# Patient Record
Sex: Male | Born: 1945 | Race: White | Hispanic: No | Marital: Married | Smoking: Never smoker
Health system: Southern US, Community
[De-identification: ages and names within clinical notes are randomized; demographics above are authoritative.]

## PROBLEM LIST (undated history)

## (undated) DIAGNOSIS — E785 Hyperlipidemia, unspecified: Secondary | ICD-10-CM

## (undated) DIAGNOSIS — I10 Essential (primary) hypertension: Secondary | ICD-10-CM

## (undated) DIAGNOSIS — R55 Syncope and collapse: Secondary | ICD-10-CM

## (undated) DIAGNOSIS — C801 Malignant (primary) neoplasm, unspecified: Secondary | ICD-10-CM

## (undated) DIAGNOSIS — R413 Other amnesia: Secondary | ICD-10-CM

## (undated) DIAGNOSIS — F32A Depression, unspecified: Secondary | ICD-10-CM

## (undated) DIAGNOSIS — R51 Headache: Secondary | ICD-10-CM

## (undated) DIAGNOSIS — E7800 Pure hypercholesterolemia, unspecified: Secondary | ICD-10-CM

## (undated) DIAGNOSIS — K219 Gastro-esophageal reflux disease without esophagitis: Secondary | ICD-10-CM

## (undated) DIAGNOSIS — F329 Major depressive disorder, single episode, unspecified: Secondary | ICD-10-CM

## (undated) DIAGNOSIS — F419 Anxiety disorder, unspecified: Secondary | ICD-10-CM

## (undated) HISTORY — DX: Malignant (primary) neoplasm, unspecified: C80.1

## (undated) HISTORY — PX: MOHS SURGERY: SUR867

## (undated) HISTORY — DX: Headache: R51

## (undated) HISTORY — DX: Other amnesia: R41.3

## (undated) HISTORY — DX: Hyperlipidemia, unspecified: E78.5

## (undated) HISTORY — DX: Anxiety disorder, unspecified: F41.9

## (undated) HISTORY — DX: Depression, unspecified: F32.A

## (undated) HISTORY — DX: Major depressive disorder, single episode, unspecified: F32.9

## (undated) HISTORY — DX: Essential (primary) hypertension: I10

## (undated) HISTORY — DX: Pure hypercholesterolemia, unspecified: E78.00

---

## 2004-11-10 ENCOUNTER — Ambulatory Visit: Payer: Self-pay | Admitting: Family Medicine

## 2005-02-10 ENCOUNTER — Ambulatory Visit: Payer: Self-pay | Admitting: Gastroenterology

## 2005-08-11 ENCOUNTER — Emergency Department: Payer: Self-pay | Admitting: Emergency Medicine

## 2005-11-09 ENCOUNTER — Encounter: Admission: RE | Admit: 2005-11-09 | Discharge: 2005-11-09 | Payer: Self-pay | Admitting: Gastroenterology

## 2005-11-15 ENCOUNTER — Encounter: Admission: RE | Admit: 2005-11-15 | Discharge: 2005-11-15 | Payer: Self-pay | Admitting: Gastroenterology

## 2009-04-19 ENCOUNTER — Emergency Department: Payer: Self-pay | Admitting: Emergency Medicine

## 2010-07-29 ENCOUNTER — Emergency Department: Payer: Self-pay | Admitting: Emergency Medicine

## 2010-08-05 ENCOUNTER — Ambulatory Visit: Payer: Self-pay | Admitting: Family Medicine

## 2012-02-19 ENCOUNTER — Ambulatory Visit (INDEPENDENT_AMBULATORY_CARE_PROVIDER_SITE_OTHER): Payer: 59 | Admitting: Psychiatry

## 2012-02-19 ENCOUNTER — Encounter (HOSPITAL_COMMUNITY): Payer: Self-pay | Admitting: Psychiatry

## 2012-02-19 VITALS — BP 130/80 | HR 52 | Wt 138.4 lb

## 2012-02-19 DIAGNOSIS — F09 Unspecified mental disorder due to known physiological condition: Secondary | ICD-10-CM

## 2012-02-19 DIAGNOSIS — F39 Unspecified mood [affective] disorder: Secondary | ICD-10-CM

## 2012-02-19 MED ORDER — DIVALPROEX SODIUM ER 250 MG PO TB24: 250.0000 mg | ORAL_TABLET | Freq: Every day | ORAL | Status: DC

## 2012-02-19 NOTE — Patient Instructions (Signed)
Please contact her primary care physician for neurology consult.  I will see you in 2 weeks.
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
## 2012-02-23 ENCOUNTER — Telehealth (HOSPITAL_COMMUNITY): Payer: Self-pay

## 2012-02-23 NOTE — Telephone Encounter (Signed)
12:34pm 02/23/12 called and left msg informed pt that a medical release form need to be complete before medical information cna be sent to dr. Webb Silversmith office./sh

## 2012-03-08 ENCOUNTER — Ambulatory Visit (HOSPITAL_COMMUNITY): Payer: Self-pay | Admitting: Psychiatry

## 2012-03-13 ENCOUNTER — Ambulatory Visit (INDEPENDENT_AMBULATORY_CARE_PROVIDER_SITE_OTHER): Payer: 59 | Admitting: Psychiatry

## 2012-03-13 ENCOUNTER — Encounter (HOSPITAL_COMMUNITY): Payer: Self-pay | Admitting: Psychiatry

## 2012-03-13 VITALS — BP 120/81 | HR 63 | Wt 161.2 lb

## 2012-03-13 DIAGNOSIS — F39 Unspecified mood [affective] disorder: Secondary | ICD-10-CM

## 2012-03-13 DIAGNOSIS — F09 Unspecified mental disorder due to known physiological condition: Secondary | ICD-10-CM

## 2012-03-13 MED ORDER — DIVALPROEX SODIUM ER 250 MG PO TB24: 250.0000 mg | ORAL_TABLET | Freq: Every day | ORAL | Status: DC
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
## 2012-04-15 ENCOUNTER — Other Ambulatory Visit (HOSPITAL_COMMUNITY): Payer: Self-pay | Admitting: *Deleted

## 2012-04-15 DIAGNOSIS — F39 Unspecified mood [affective] disorder: Secondary | ICD-10-CM

## 2012-04-15 MED ORDER — DIVALPROEX SODIUM ER 250 MG PO TB24: 250.0000 mg | ORAL_TABLET | Freq: Every day | ORAL | Status: DC

## 2012-04-24 ENCOUNTER — Ambulatory Visit (INDEPENDENT_AMBULATORY_CARE_PROVIDER_SITE_OTHER): Payer: 59 | Admitting: Psychiatry

## 2012-04-24 ENCOUNTER — Ambulatory Visit (HOSPITAL_COMMUNITY): Payer: Self-pay | Admitting: Psychiatry

## 2012-04-24 ENCOUNTER — Encounter (HOSPITAL_COMMUNITY): Payer: Self-pay | Admitting: Psychiatry

## 2012-04-24 VITALS — BP 139/83 | HR 55 | Wt 162.0 lb

## 2012-04-24 DIAGNOSIS — F09 Unspecified mental disorder due to known physiological condition: Secondary | ICD-10-CM

## 2012-04-24 DIAGNOSIS — F39 Unspecified mood [affective] disorder: Secondary | ICD-10-CM

## 2012-04-24 DIAGNOSIS — F0630 Mood disorder due to known physiological condition, unspecified: Secondary | ICD-10-CM
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
## 2012-06-11 ENCOUNTER — Other Ambulatory Visit (HOSPITAL_COMMUNITY): Payer: Self-pay | Admitting: *Deleted

## 2012-06-11 DIAGNOSIS — F39 Unspecified mood [affective] disorder: Secondary | ICD-10-CM

## 2012-06-11 MED ORDER — DIVALPROEX SODIUM ER 250 MG PO TB24: 250.0000 mg | ORAL_TABLET | Freq: Every day | ORAL | Status: DC

## 2012-07-17 ENCOUNTER — Ambulatory Visit (HOSPITAL_COMMUNITY): Payer: Self-pay | Admitting: Psychiatry

## 2012-08-12 ENCOUNTER — Other Ambulatory Visit (HOSPITAL_COMMUNITY): Payer: Self-pay | Admitting: *Deleted

## 2012-08-12 DIAGNOSIS — F39 Unspecified mood [affective] disorder: Secondary | ICD-10-CM

## 2012-08-12 NOTE — Telephone Encounter (Signed)
Received faxed refill request for Divalproex ER. Per appointment note for appt 07/17/12, patient cancelled appt.  Pt is consolidating care and will not return to this office.

## 2012-08-13 ENCOUNTER — Other Ambulatory Visit (HOSPITAL_COMMUNITY): Payer: Self-pay | Admitting: Psychiatry

## 2012-10-04 ENCOUNTER — Ambulatory Visit: Payer: Self-pay | Admitting: Family Medicine

## 2012-11-18 ENCOUNTER — Ambulatory Visit: Payer: Self-pay | Admitting: Nurse Practitioner

## 2012-12-03 ENCOUNTER — Encounter: Payer: Self-pay | Admitting: Nurse Practitioner

## 2012-12-03 ENCOUNTER — Ambulatory Visit (INDEPENDENT_AMBULATORY_CARE_PROVIDER_SITE_OTHER): Payer: BC Managed Care – PPO | Admitting: Nurse Practitioner

## 2012-12-03 VITALS — BP 127/73 | HR 58 | Ht 68.75 in | Wt 165.0 lb

## 2012-12-03 DIAGNOSIS — F341 Dysthymic disorder: Secondary | ICD-10-CM

## 2012-12-03 DIAGNOSIS — E785 Hyperlipidemia, unspecified: Secondary | ICD-10-CM

## 2012-12-03 DIAGNOSIS — R413 Other amnesia: Secondary | ICD-10-CM

## 2012-12-03 DIAGNOSIS — I10 Essential (primary) hypertension: Secondary | ICD-10-CM | POA: Insufficient documentation

## 2012-12-03 NOTE — Patient Instructions (Addendum)
Setup for neuropsych testing with Dr. Leonides Cave Continue your followup with Dr. Loralie Champagne  Office Followup yearly

## 2012-12-03 NOTE — Progress Notes (Signed)
HPI: The patient returns for followup after his last visit 05/20/2012. His neuropsych evaluation in October of last year showed anxiety and depressive disorder and a treatment plan of counseling and medication was recommended. Followup exam in one year to 18 months. He was referred to a local psychiatrist and is now in counseling. 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 He is currently on Adderall and his symptoms are much better. He is also on antidepressant medication. He has no new neurologic complaints  ROS:  -ringing in ears, constipation, urination problems, impotence, cramps, aching muscles, tremor, depression, anxiety, too much sleep  Physical Exam General: well developed, well nourished, seated, in no evident distress Head: head normocephalic and atraumatic. Oropharynx benign Neck: supple with no carotid or supraclavicular bruits Cardiovascular: regular rate and rhythm, no murmurs  Neurologic Exam Mental Status: Awake and fully alert. Oriented to place and time.  Attention span, concentration and fund of knowledge appropriate. Mood and affect appropriate.  Cranial Nerves: Pupils equal, briskly reactive to light. Extraocular movements full without nystagmus. Visual fields full to confrontation. Hearing intact and symmetric to finger snap. Facial sensation intact. Face, tongue, palate move normally and symmetrically. Neck flexion and extension normal.  Motor: Normal bulk and tone. Normal strength in all tested extremity muscles. Sensory.: intact to touch and pinprick and vibratory.  Coordination: Rapid alternating movements normal in all extremities. Finger-to-nose and heel-to-shin performed accurately bilaterally. Gait and Station: Arises from chair without difficulty. Stance is normal. Gait demonstrates normal stride length and balance . Able to heel, toe and tandem walk without difficulty.  Reflexes: 2+ and symmetric. Toes downgoing.     ASSESSMENT: Past history of hypertension,  hyperlipidemia depression. Neuropsych evaluation results are not consistent with dementia or mild cognitive impairment. He is currently seeing a counselor as well as psychiatrist. He has been told he has ADD and is on Adderall with good results      PLAN: Setup for neuropsych testing with Dr. Leonides Cave in September or October 2014 Continue  followup with Dr. Rolly Pancake [facility]  Office Followup yearly  Nilda Riggs, [place] APRN

## 2012-12-05 ENCOUNTER — Ambulatory Visit: Payer: Self-pay | Admitting: Hematology and Oncology

## 2012-12-06 ENCOUNTER — Ambulatory Visit: Payer: Self-pay | Admitting: Hematology and Oncology

## 2012-12-29 ENCOUNTER — Ambulatory Visit: Payer: Self-pay | Admitting: Hematology and Oncology

## 2013-05-05 ENCOUNTER — Telehealth: Payer: Self-pay | Admitting: Nurse Practitioner

## 2013-05-05 DIAGNOSIS — R413 Other amnesia: Secondary | ICD-10-CM

## 2013-12-03 ENCOUNTER — Telehealth: Payer: Self-pay | Admitting: Nurse Practitioner

## 2013-12-03 ENCOUNTER — Ambulatory Visit: Payer: BC Managed Care – PPO | Admitting: Nurse Practitioner

## 2013-12-03 NOTE — Telephone Encounter (Signed)
No show for scheduled appt 

## 2014-10-19 ENCOUNTER — Ambulatory Visit: Payer: Self-pay | Admitting: Adult Health

## 2015-05-27 NOTE — Telephone Encounter (Signed)
Error

## 2015-06-14 ENCOUNTER — Encounter: Payer: Self-pay | Admitting: Emergency Medicine

## 2015-06-14 ENCOUNTER — Emergency Department
Admission: EM | Admit: 2015-06-14 | Discharge: 2015-06-14 | Disposition: A | Discharge status: Home / Self Care | Payer: Medicare Other | Attending: Emergency Medicine | Admitting: Emergency Medicine

## 2015-06-14 DIAGNOSIS — Z7982 Long term (current) use of aspirin: Secondary | ICD-10-CM | POA: Insufficient documentation

## 2015-06-14 DIAGNOSIS — R45851 Suicidal ideations: Secondary | ICD-10-CM | POA: Diagnosis not present

## 2015-06-14 DIAGNOSIS — F329 Major depressive disorder, single episode, unspecified: Secondary | ICD-10-CM | POA: Diagnosis not present

## 2015-06-14 DIAGNOSIS — Z79899 Other long term (current) drug therapy: Secondary | ICD-10-CM | POA: Diagnosis not present

## 2015-06-14 DIAGNOSIS — F32A Depression, unspecified: Secondary | ICD-10-CM

## 2015-06-14 DIAGNOSIS — Z7951 Long term (current) use of inhaled steroids: Secondary | ICD-10-CM | POA: Insufficient documentation

## 2015-06-14 DIAGNOSIS — I10 Essential (primary) hypertension: Secondary | ICD-10-CM | POA: Diagnosis not present

## 2015-06-14 DIAGNOSIS — R4182 Altered mental status, unspecified: Secondary | ICD-10-CM | POA: Diagnosis present

## 2015-06-14 DIAGNOSIS — R51 Headache: Secondary | ICD-10-CM

## 2015-06-14 DIAGNOSIS — R519 Headache, unspecified: Secondary | ICD-10-CM | POA: Diagnosis present

## 2015-06-14 DIAGNOSIS — F39 Unspecified mood [affective] disorder: Secondary | ICD-10-CM | POA: Diagnosis not present

## 2015-06-14 DIAGNOSIS — R413 Other amnesia: Secondary | ICD-10-CM | POA: Diagnosis present

## 2015-06-14 LAB — CBC
HCT: 49.7 % (ref 40.0–52.0)
Hemoglobin: 16.8 g/dL (ref 13.0–18.0)
MCH: 30.1 pg (ref 26.0–34.0)
MCHC: 33.7 g/dL (ref 32.0–36.0)
MCV: 89.2 fL (ref 80.0–100.0)
PLATELETS: 156 10*3/uL (ref 150–440)
RBC: 5.57 MIL/uL (ref 4.40–5.90)
RDW: 12.4 % (ref 11.5–14.5)
WBC: 4.8 10*3/uL (ref 3.8–10.6)

## 2015-06-14 LAB — URINE DRUG SCREEN, QUALITATIVE (ARMC ONLY)
Amphetamines, Ur Screen: POSITIVE — AB
BENZODIAZEPINE, UR SCRN: NOT DETECTED
Barbiturates, Ur Screen: NOT DETECTED
CANNABINOID 50 NG, UR ~~LOC~~: NOT DETECTED
Cocaine Metabolite,Ur ~~LOC~~: NOT DETECTED
MDMA (ECSTASY) UR SCREEN: NOT DETECTED
Methadone Scn, Ur: NOT DETECTED
Opiate, Ur Screen: NOT DETECTED
PHENCYCLIDINE (PCP) UR S: NOT DETECTED
Tricyclic, Ur Screen: NOT DETECTED

## 2015-06-14 LAB — SALICYLATE LEVEL: Salicylate Lvl: <4 mg/dL (ref 2.8–30.0)

## 2015-06-14 LAB — COMPREHENSIVE METABOLIC PANEL
ALT: 22 U/L (ref 17–63)
AST: 25 U/L (ref 15–41)
Albumin: 4.5 g/dL (ref 3.5–5.0)
Alkaline Phosphatase: 75 U/L (ref 38–126)
Anion gap: 3 — ABNORMAL LOW (ref 5–15)
BILIRUBIN TOTAL: 1 mg/dL (ref 0.3–1.2)
BUN: 18 mg/dL (ref 6–20)
CO2: 30 mmol/L (ref 22–32)
CREATININE: 1.14 mg/dL (ref 0.61–1.24)
Calcium: 9.6 mg/dL (ref 8.9–10.3)
Chloride: 106 mmol/L (ref 101–111)
GFR calc Af Amer: >60 mL/min (ref >60)
Glucose, Bld: 114 mg/dL — ABNORMAL HIGH (ref 65–99)
Potassium: 4.7 mmol/L (ref 3.5–5.1)
Sodium: 139 mmol/L (ref 135–145)
TOTAL PROTEIN: 7.3 g/dL (ref 6.5–8.1)

## 2015-06-14 LAB — ACETAMINOPHEN LEVEL: Acetaminophen (Tylenol), Serum: <10 ug/mL — ABNORMAL LOW (ref 10–30)

## 2015-06-14 LAB — ETHANOL

## 2015-06-14 NOTE — ED Notes (Signed)
BEHAVIORAL HEALTH ROUNDING  Patient sleeping: No.  Patient alert and oriented: yes  Behavior appropriate: Yes. ; If no, describe:  Nutrition and fluids offered: Yes  Toileting and hygiene offered: Yes  Sitter present: yes- 15 min safety sitter  Law enforcement present: Yes  ENVIRONMENTAL ASSESSMENT  Potentially harmful objects out of patient reach: Yes.  Personal belongings secured: Yes.  Patient dressed in hospital provided attire only: Yes.  Plastic bags out of patient reach: Yes.  Patient care equipment (cords, cables, call bells, lines, and drains) shortened, removed, or accounted for: Yes.  Equipment and supplies removed from bottom of stretcher: Yes.  Potentially toxic materials out of patient reach: Yes.  Sharps container removed or out of patient reach: Yes.

## 2015-06-14 NOTE — ED Notes (Signed)
EDP at bedside for examine.
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
## 2015-06-14 NOTE — BH Assessment (Signed)
Received(via fax) a copy of the "Referral List" of Psych MD's, from the office of the patient's current outpatient provider (Dr. Ulis Rias).  They are expecting to close at the end of this month (06/2015).  A copy was giving to the patient.   Patient continues to deny SI/HI and AV/H.

## 2015-06-14 NOTE — ED Notes (Signed)
Pt informed to return if any life threatening symptoms occur.  

## 2015-06-14 NOTE — ED Notes (Signed)
App. 2 months ago, stopped one of his migraine medications, started having auras for migraine but no headaches, last couple of weeks, experiencing auras related to migraines, now states feeling little confused and disoriented, sounds are "hollow", not sure about balance, words are harder and fogginess

## 2015-06-14 NOTE — ED Notes (Signed)
Pt to ed with c/o headache x several weeks,  Reports over the last 5 days he has had increased slurred speech and difficulty forming words.
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
## 2015-06-14 NOTE — Discharge Instructions (Signed)
Please call Dr. Bridgett Larsson for follow-up. Return to the emergency department if you have further urgent concerns.  Major Depressive Disorder Major depressive disorder is a mental illness. It also may be called clinical depression or unipolar depression. Major depressive disorder usually causes feelings of sadness, hopelessness, or helplessness. Some people with this disorder do not feel particularly sad but lose interest in doing things they used to enjoy (anhedonia). Major depressive disorder also can cause physical symptoms. It can interfere with work, school, relationships, and other normal everyday activities. The disorder varies in severity but is longer lasting and more serious than the sadness we all feel from time to time in our lives. Major depressive disorder often is triggered by stressful life events or major life changes. Examples of these triggers include divorce, loss of your job or home, a move, and the death of a family member or close friend. Sometimes this disorder occurs for no obvious reason at all. People who have family members with major depressive disorder or bipolar disorder are at higher risk for developing this disorder, with or without life stressors. Major depressive disorder can occur at any age. It may occur just once in your life (single episode major depressive disorder). It may occur multiple times (recurrent major depressive disorder). SYMPTOMS People with major depressive disorder have either anhedonia or depressed mood on nearly a daily basis for at least 2 weeks or longer. Symptoms of depressed mood include:  Feelings of sadness (blue or down in the dumps) or emptiness.  Feelings of hopelessness or helplessness.  Tearfulness or episodes of crying (may be observed by others).  Irritability (children and adolescents). In addition to depressed mood or anhedonia or both, people with this disorder have at least four of the following symptoms:  Difficulty sleeping or  sleeping too much.   Significant change (increase or decrease) in appetite or weight.   Lack of energy or motivation.  Feelings of guilt and worthlessness.   Difficulty concentrating, remembering, or making decisions.  Unusually slow movement (psychomotor retardation) or restlessness (as observed by others).   Recurrent wishes for death, recurrent thoughts of self-harm (suicide), or a suicide attempt. People with major depressive disorder commonly have persistent negative thoughts about themselves, other people, and the world. People with severe major depressive disorder may experiencedistorted beliefs or perceptions about the world (psychotic delusions). They also may see or hear things that are not real (psychotic hallucinations). DIAGNOSIS Major depressive disorder is diagnosed through an assessment by your health care provider. Your health care provider will ask aboutaspects of your daily life, such as mood,sleep, and appetite, to see if you have the diagnostic symptoms of major depressive disorder. Your health care provider may ask about your medical history and use of alcohol or drugs, including prescription medicines. Your health care provider also may do a physical exam and blood work. This is because certain medical conditions and the use of certain substances can cause major depressive disorder-like symptoms (secondary depression). Your health care provider also may refer you to a mental health specialist for further evaluation and treatment. TREATMENT It is important to recognize the symptoms of major depressive disorder and seek treatment. The following treatments can be prescribed for this disorder:   Medicine. Antidepressant medicines usually are prescribed. Antidepressant medicines are thought to correct chemical imbalances in the brain that are commonly associated with major depressive disorder. Other types of medicine may be added if the symptoms do not respond to  antidepressant medicines alone or if psychotic delusions  or hallucinations occur.  Talk therapy. Talk therapy can be helpful in treating major depressive disorder by providing support, education, and guidance. Certain types of talk therapy also can help with negative thinking (cognitive behavioral therapy) and with relationship issues that trigger this disorder (interpersonal therapy). A mental health specialist can help determine which treatment is best for you. Most people with major depressive disorder do well with a combination of medicine and talk therapy. Treatments involving electrical stimulation of the brain can be used in situations with extremely severe symptoms or when medicine and talk therapy do not work over time. These treatments include electroconvulsive therapy, transcranial magnetic stimulation, and vagal nerve stimulation.   This information is not intended to replace advice given to you by your health care provider. Make sure you discuss any questions you have with your health care provider.   Document Released: 11/11/2012 Document Revised: 08/07/2014 Document Reviewed: 11/11/2012 Elsevier Interactive Patient Education [employer].

## 2015-06-14 NOTE — ED Provider Notes (Signed)
[facility] Emergency Department Provider Note   ____________________________________________  Time seen:  I have reviewed the triage vital signs and the triage nursing note.  HISTORY  Chief Complaint Migraine and Altered Mental Status   Historian Patient  HPI Brad Randall is a 69 y.o. male who is a history of depression, and migraines, is here for evaluation of worsening depression, labile mood/agitation, as well as vague thoughts of suicide including that he would be better off "gone. "  Patient states he has been admitted to the hospital for depression and homicidal/suicidal ideation in the 64s. He is followed by Dr.Chen? In psychiatry. Patient states that he had taken himself off of the escalapram for several weeks before restarting about a month ago because he thought he probably didn't need it.  Patient states he's had some brain fog or trouble concentrating and trouble assimilating new information over the past several years which makes him worried that he could be developing Alzheimer's like his uncle. Patient states that he has migraine auras frequently, but he had taken himself off ofhis migraine medications.  Today he is not here for migraine. He is not here for confusion or altered mental status. He is here for help with depression and suicidal thoughts. He states that he has always been able to stop himself by thinking about his children and grandchildren. He is here for psychiatric help.    Past Medical History  Diagnosis Date  . Headache(784.0)   . HTN (hypertension)   . Hyperlipidemia   . High cholesterol   . Anxiety and depression   . Cancer ([place])   . Memory loss     Patient Active Problem List   Diagnosis Date Noted  . Memory loss 12/03/2012  . 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 Unspecified essential hypertension 12/03/2012  . Other and unspecified hyperlipidemia 12/03/2012  . HA (headache) 02/19/2012    History reviewed. No  pertinent past surgical history.  Current Outpatient Rx  Name  Route  Sig  Dispense  Refill  . escitalopram (LEXAPRO) 5 MG tablet   Oral   Take 5 mg by mouth daily.         . fluticasone (FLONASE) 50 MCG/ACT nasal spray   Each Nare   Place into both nostrils daily.         Marland Kitchen ALPRAZolam (XANAX) 0.25 MG tablet               . amphetamine-dextroamphetamine (ADDERALL) 5 MG tablet   Oral   Take 5 mg by mouth 2 (two) times daily. Take one tab in the morning and one tab in the pm         . aspirin 81 MG tablet   Oral   Take 81 mg by mouth daily.         . bisoprolol-hydrochlorothiazide (ZIAC) 2.5-6.25 MG per tablet               . EXPIRED: divalproex (DEPAKOTE ER) 250 MG 24 hr tablet   Oral   Take 1 tablet (250 mg total) by mouth at bedtime.   30 tablet   1   . naratriptan (AMERGE) 2.5 MG tablet   Oral   Take 2.5 mg by mouth as needed for migraine.          Marland Kitchen PARoxetine (PAXIL) 10 MG tablet   Oral   Take by mouth at bedtime. Take 15 mg at bedtime         . potassium chloride (  K-DUR) 10 MEQ tablet   Oral   Take 10 mEq by mouth daily.         Marland Kitchen zonisamide (ZONEGRAN) 25 MG capsule                 Allergies Review of patient's allergies indicates no known allergies.  Family History  Problem Relation Age of Onset  . Cancer    . Cancer Other     Social History Social History  Substance Use Topics  . Smoking status: Never Smoker   . Smokeless tobacco: Never Used  . Alcohol Use: No    Review of Systems  Constitutional: Negative for fever. Eyes: Negative for visual changes. ENT: Negative for sore throat. Cardiovascular: Negative for chest pain. Respiratory: Negative for shortness of breath. Gastrointestinal: Negative for abdominal pain, vomiting and diarrhea. Genitourinary: Negative for dysuria. Musculoskeletal: Negative for back pain. Skin: Negative for rash. Neurological: Negative for headache. 10 point Review of Systems otherwise  negative ____________________________________________   PHYSICAL EXAM:  VITAL SIGNS: ED Triage Vitals  Enc Vitals Group     BP 06/14/15 1040 161/84 mmHg     Pulse Rate 06/14/15 1040 53     Resp 06/14/15 1040 16     Temp 06/14/15 1040 97.7 F (36.5 C)     Temp Source 06/14/15 1040 Oral     SpO2 06/14/15 1040 99 %     Weight 06/14/15 1040 150 lb (68.04 kg)     Height 06/14/15 1040 5\' 8"  (1.727 m)     Head Cir --      Peak Flow --      Pain Score --      Pain Loc --      Pain Edu? --      Excl. in [place]? --      Constitutional: Alert and oriented. Well appearing and in no distress. Eyes: Conjunctivae are normal. PERRL. Normal extraocular movements. ENT   Head: Normocephalic and atraumatic.   Nose: No congestion/rhinnorhea.   Mouth/Throat: Mucous membranes are moist.   Neck: No stridor. Cardiovascular/Chest: Normal rate, regular rhythm.  No murmurs, rubs, or gallops. Respiratory: Normal respiratory effort without tachypnea nor retractions. Breath sounds are clear and equal bilaterally. No wheezes/rales/rhonchi. Gastrointestinal: Soft. No distention, no guarding, no rebound. Nontender   Genitourinary/rectal:Deferred Musculoskeletal: Nontender with normal range of motion in all extremities. No joint effusions.  No lower extremity tenderness.  No edema. Neurologic:  Normal speech and language. No gross or focal neurologic deficits are appreciated. Skin:  Skin is warm, dry and intact. No rash noted. Psychiatric: Moderate depressed mood with vague suicidal ideation. No homicidal ideation. Appropriate insight and judgment.  ____________________________________________   EKG I, Lisa Roca, MD, the attending physician have personally viewed and interpreted all ECGs.  No EKG performed ____________________________________________  LABS (pertinent positives/negatives)  Comprehensive metabolic panel within normal limits Alcohol less than 5 Acetaminophen and  salicylate levels negative CBC within normal limits Urine drug screen positive for amphetamine and otherwise negative  ____________________________________________  RADIOLOGY All Xrays were viewed by me. Imaging interpreted by Radiologist.  None __________________________________________  PROCEDURES  Procedure(s) performed: None  Critical Care performed: None  ____________________________________________   ED COURSE / ASSESSMENT AND PLAN  CONSULTATIONS: Consult to TTS and psychiatry  Pertinent labs & imaging results that were available during my care of the patient were reviewed by me and considered in my medical decision making (see chart for details).   The patient is here for psychiatric help due to depression,  agitated mood, and vague suicidal thoughts. He does describe his previous problems with migraines and trouble assimilating new information, however this is not an acute problem nor his complaint today.  Given his suicidal ideation and is depressed mood, along with his history, I have placed him under involuntary commitment for safety until he is evaluated by a psychiatrist.  At present he is very cooperative, and understanding, and I feel he is safe for the move to the behavioral unit as space needs.  Patient / Family / Caregiver informed of clinical course, medical decision-making process, and agree with plan.   ___________________________________________   FINAL CLINICAL IMPRESSION(S) / ED DIAGNOSES   Final diagnoses:  Depression  Suicidal ideation       Lisa Roca, MD 06/14/15 1435

## 2015-06-14 NOTE — ED Provider Notes (Addendum)
-----------------------------------------   5:31 PM on 06/14/2015 -----------------------------------------  Patient has been seen by Dr. [place] Cooks. The patient does not have any suicidal homicidal ideation. Dr. [place] Cooks has rescinded the involuntary commitment and reports the patient is stable for discharge. He is a patient of Dr. Lianne Moris, and though he may need to change provider's, the plan is for him to follow-up with Dr. Bridgett Larsson later this week.    Ahmed Prima, MD 06/14/15 1732  Ahmed Prima, MD 06/14/15 [phone]

## 2015-09-10 ENCOUNTER — Encounter: Payer: Self-pay | Admitting: *Deleted

## 2015-09-13 ENCOUNTER — Ambulatory Visit
Admission: RE | Admit: 2015-09-13 | Discharge: 2015-09-13 | Disposition: A | Discharge status: Home Health | Payer: Medicare Other | Source: Ambulatory Visit | Attending: Gastroenterology | Admitting: Gastroenterology

## 2015-09-13 ENCOUNTER — Encounter: Payer: Self-pay | Admitting: *Deleted

## 2015-09-13 ENCOUNTER — Ambulatory Visit: Payer: Medicare Other | Admitting: Anesthesiology

## 2015-09-13 ENCOUNTER — Encounter
Admission: RE | Disposition: A | Discharge status: Home Health | Payer: Self-pay | Source: Ambulatory Visit | Attending: Gastroenterology

## 2015-09-13 DIAGNOSIS — F419 Anxiety disorder, unspecified: Secondary | ICD-10-CM | POA: Insufficient documentation

## 2015-09-13 DIAGNOSIS — I10 Essential (primary) hypertension: Secondary | ICD-10-CM | POA: Diagnosis not present

## 2015-09-13 DIAGNOSIS — D124 Benign neoplasm of descending colon: Secondary | ICD-10-CM | POA: Diagnosis not present

## 2015-09-13 DIAGNOSIS — C210 Malignant neoplasm of anus, unspecified: Secondary | ICD-10-CM | POA: Diagnosis not present

## 2015-09-13 DIAGNOSIS — K5900 Constipation, unspecified: Secondary | ICD-10-CM | POA: Insufficient documentation

## 2015-09-13 DIAGNOSIS — E7800 Pure hypercholesterolemia, unspecified: Secondary | ICD-10-CM | POA: Diagnosis not present

## 2015-09-13 DIAGNOSIS — M179 Osteoarthritis of knee, unspecified: Secondary | ICD-10-CM | POA: Diagnosis not present

## 2015-09-13 DIAGNOSIS — K625 Hemorrhage of anus and rectum: Secondary | ICD-10-CM | POA: Diagnosis present

## 2015-09-13 DIAGNOSIS — G43909 Migraine, unspecified, not intractable, without status migrainosus: Secondary | ICD-10-CM | POA: Insufficient documentation

## 2015-09-13 DIAGNOSIS — Z7951 Long term (current) use of inhaled steroids: Secondary | ICD-10-CM | POA: Insufficient documentation

## 2015-09-13 DIAGNOSIS — F329 Major depressive disorder, single episode, unspecified: Secondary | ICD-10-CM | POA: Insufficient documentation

## 2015-09-13 DIAGNOSIS — Z79899 Other long term (current) drug therapy: Secondary | ICD-10-CM | POA: Insufficient documentation

## 2015-09-13 DIAGNOSIS — Z85828 Personal history of other malignant neoplasm of skin: Secondary | ICD-10-CM | POA: Insufficient documentation

## 2015-09-13 DIAGNOSIS — E785 Hyperlipidemia, unspecified: Secondary | ICD-10-CM | POA: Diagnosis not present

## 2015-09-13 HISTORY — PX: COLONOSCOPY WITH PROPOFOL: SHX5780

## 2015-09-13 SURGERY — COLONOSCOPY WITH PROPOFOL
Anesthesia: General

## 2015-09-13 MED ORDER — PROPOFOL 10 MG/ML IV BOLUS
INTRAVENOUS | Status: DC | PRN
  Administered 2015-09-13: 40 mg via INTRAVENOUS

## 2015-09-13 MED ORDER — FENTANYL CITRATE (PF) 100 MCG/2ML IJ SOLN
INTRAMUSCULAR | Status: DC | PRN
  Administered 2015-09-13: 50 ug via INTRAVENOUS

## 2015-09-13 MED ORDER — SODIUM CHLORIDE 0.9 % IV SOLN
INTRAVENOUS | Status: DC
  Administered 2015-09-13 (×2): via INTRAVENOUS

## 2015-09-13 MED ORDER — PROPOFOL 500 MG/50ML IV EMUL
INTRAVENOUS | Status: DC | PRN
  Administered 2015-09-13: 80 ug/kg/min via INTRAVENOUS

## 2015-09-13 NOTE — Transfer of Care (Signed)
Immediate Anesthesia Transfer of Care Note  Patient: Brad Randall  Procedure(s) Performed: Procedure(s): COLONOSCOPY WITH PROPOFOL (N/A)  Patient Location: PACU  Anesthesia Type:General  Level of Consciousness: awake, alert  and sedated   Airway & Oxygen Therapy: Patient Spontanous Breathing  Post-op Assessment: Report given to RN and Post -op Vital signs reviewed and stable  Post vital signs: Reviewed and stable  Last Vitals:  Filed Vitals:   09/13/15 1500  BP: 147/93  Pulse: 61  Temp: 37 C  Resp: 18    Complications: No apparent anesthesia complications

## 2015-09-13 NOTE — H&P (Signed)
  Date of Initial H&P: 08/30/2015  History reviewed, patient examined, no change in status, stable for surgery.

## 2015-09-13 NOTE — Op Note (Signed)
[facility] Gastroenterology Patient Name: Brad Randall Procedure Date: 09/13/2015 3:18 PM MRN: [id number] Account #: [id number] Date of Birth: 1946-06-24 Admit Type: Outpatient Age: 70 Room: [facility] ENDO ROOM 4 Gender: Male Note Status: Finalized Procedure:         Colonoscopy Indications:       Rectal bleeding Providers:         Lupita Dawn. Candace Cruise, MD Referring MD:      Irven Easterly. Kary Kos, MD (Referring MD) Medicines:         Monitored Anesthesia Care Complications:     No immediate complications. Procedure:         Pre-Anesthesia Assessment:                    - Prior to the procedure, a History and Physical was                     performed, and patient medications, allergies and                     sensitivities were reviewed. The patient's tolerance of                     previous anesthesia was reviewed.                    - The risks and benefits of the procedure and the sedation                     options and risks were discussed with the patient. All                     questions were answered and informed consent was obtained.                    - After reviewing the risks and benefits, the patient was                     deemed in satisfactory condition to undergo the procedure.                    After obtaining informed consent, the colonoscope was                     passed under direct vision. Throughout the procedure, the                     patient's blood pressure, pulse, and oxygen saturations                     were monitored continuously. The Colonoscope was                     introduced through the anus and advanced to the the cecum,                     identified by appendiceal orifice and ileocecal valve. The                     colonoscopy was performed without difficulty. The patient                     tolerated the procedure well. The quality of the bowel  preparation was good. Findings:      The digital rectal exam revealed  a firm anal mass.      A small polyp was found in the descending colon. The polyp was sessile.       The polyp was removed with a jumbo cold forceps. Resection and retrieval       were complete.      The exam was otherwise without abnormality. Impression:        - Anal mass.                    - One small polyp in the descending colon, removed with a                     jumbo cold forceps. Resected and retrieved.                    - The examination was otherwise normal. Recommendation:    - Discharge patient to home.                    - Await pathology results.                    - The findings and recommendations were discussed with the                     patient. Procedure Code(s): --- Professional ---                    [phone], Colonoscopy, flexible; with biopsy, single or                     multiple Diagnosis Code(s): --- Professional ---                    K62.89, Other specified diseases of anus and rectum                    D12.4, Benign neoplasm of descending colon                    K62.5, Hemorrhage of anus and rectum CPT copyright 2016 American Medical Association. All rights reserved. The codes documented in this report are preliminary and upon coder review may  be revised to meet current compliance requirements. Hulen Luster, MD 09/13/2015 3:43:10 PM This report has been signed electronically. Number of Addenda: 0 Note Initiated On: 09/13/2015 3:18 PM Scope Withdrawal Time: 0 hours 9 minutes 0 seconds  Total Procedure Duration: 0 hours 13 minutes 22 seconds       [facility]

## 2015-09-13 NOTE — Anesthesia Preprocedure Evaluation (Signed)
Anesthesia Evaluation  Patient identified by MRN, date of birth, ID band Patient awake    Reviewed: Allergy & Precautions, H&P , NPO status , Patient's Chart, lab work & pertinent test results, reviewed documented beta blocker date and time   Airway Mallampati: II   Neck ROM: full    Dental  (+) Teeth Intact   Pulmonary neg pulmonary ROS,    Pulmonary exam normal        Cardiovascular hypertension, negative cardio ROS Normal cardiovascular exam     Neuro/Psych  Headaches, PSYCHIATRIC DISORDERS negative neurological ROS  negative psych ROS   GI/Hepatic negative GI ROS, Neg liver ROS,   Endo/Other  negative endocrine ROS  Renal/GU negative Renal ROS  negative genitourinary   Musculoskeletal   Abdominal   Peds  Hematology negative hematology ROS (+)   Anesthesia Other Findings Past Medical History:   Headache(784.0)                                              HTN (hypertension)                                           Hyperlipidemia                                               High cholesterol                                             Anxiety and depression                                       Cancer ([place])                                                 Memory loss                                                History reviewed. No pertinent surgical history.   Reproductive/Obstetrics                             Anesthesia Physical Anesthesia Plan  ASA: II  Anesthesia Plan: General   Post-op Pain Management:    Induction:   Airway Management Planned:   Additional Equipment:   Intra-op Plan:   Post-operative Plan:   Informed Consent: I have reviewed the patients History and Physical, chart, labs and discussed the procedure including the risks, benefits and alternatives for the proposed anesthesia with the patient or authorized representative who has indicated his/her  understanding and acceptance.   Dental Advisory Given  Plan Discussed with: CRNA  Anesthesia Plan Comments:         Anesthesia Quick Evaluation

## 2015-09-15 ENCOUNTER — Encounter: Payer: Self-pay | Admitting: Gastroenterology

## 2015-09-16 LAB — SURGICAL PATHOLOGY

## 2015-09-16 NOTE — Anesthesia Postprocedure Evaluation (Signed)
Anesthesia Post Note  Patient: Brad Randall  Procedure(s) Performed: Procedure(s) (LRB): COLONOSCOPY WITH PROPOFOL (N/A)  Patient location during evaluation: PACU Anesthesia Type: General Level of consciousness: awake and alert Pain management: pain level controlled Vital Signs Assessment: post-procedure vital signs reviewed and stable Respiratory status: spontaneous breathing, nonlabored ventilation, respiratory function stable and patient connected to nasal cannula oxygen Cardiovascular status: blood pressure returned to baseline and stable Postop Assessment: no signs of nausea or vomiting Anesthetic complications: no    Last Vitals:  Filed Vitals:   09/13/15 1607 09/13/15 1617  BP: 156/81 153/81  Pulse: 56 58  Temp:    Resp: 14 14    Last Pain: There were no vitals filed for this visit.               Molli Barrows

## 2015-09-20 ENCOUNTER — Other Ambulatory Visit: Payer: Self-pay | Admitting: Surgery

## 2015-09-21 ENCOUNTER — Telehealth: Payer: Self-pay

## 2015-09-21 NOTE — Telephone Encounter (Signed)
  Oncology Nurse Navigator Documentation  Navigator Location: [facility] (09/21/15 1400) Navigator Encounter Type: Telephone (09/21/15 1400) Telephone: Outgoing Call (09/21/15 1400)           Treatment Phase: Pre-Tx/Tx Discussion (09/21/15 1400)     Interventions: Coordination of Care (09/21/15 1400)   Coordination of Care: EUS (09/21/15 1400)        Acuity: Level 2 (09/21/15 1400)   Acuity Level 2: Initial guidance, education and coordination as needed;Educational needs;Ongoing guidance and education throughout treatment as needed (09/21/15 1400)     Time Spent with Patient: 45 (09/21/15 1400)   Received referral for lower EUS for new diagnosis of anal cancer. EUS arranged for 09/30/15 at [facility] with Dr Mont Dutton. Went over instructions and also mailed a copy along with prep to home address. Provided my contact information for any further questions or concerns.

## 2015-09-22 ENCOUNTER — Inpatient Hospital Stay: Payer: Medicare Other | Attending: Oncology | Admitting: Oncology

## 2015-09-22 VITALS — BP 132/77 | HR 52 | Temp 97.1°F | Resp 18 | Ht 69.09 in | Wt 156.1 lb

## 2015-09-22 DIAGNOSIS — K5900 Constipation, unspecified: Secondary | ICD-10-CM | POA: Diagnosis not present

## 2015-09-22 DIAGNOSIS — I10 Essential (primary) hypertension: Secondary | ICD-10-CM | POA: Diagnosis not present

## 2015-09-22 DIAGNOSIS — F329 Major depressive disorder, single episode, unspecified: Secondary | ICD-10-CM | POA: Diagnosis not present

## 2015-09-22 DIAGNOSIS — Z79899 Other long term (current) drug therapy: Secondary | ICD-10-CM | POA: Diagnosis not present

## 2015-09-22 DIAGNOSIS — E7800 Pure hypercholesterolemia, unspecified: Secondary | ICD-10-CM | POA: Diagnosis not present

## 2015-09-22 DIAGNOSIS — C210 Malignant neoplasm of anus, unspecified: Secondary | ICD-10-CM | POA: Diagnosis present

## 2015-09-22 DIAGNOSIS — F419 Anxiety disorder, unspecified: Secondary | ICD-10-CM | POA: Diagnosis not present

## 2015-09-24 NOTE — Progress Notes (Signed)
[facility]  [phone] [phone]  ID: Brad Randall OB: [birth year]  MR#: [id number]  [id number]  Patient Care Team: [place] Pink, MD as PCP - General (Family Medicine)  CHIEF COMPLAINT:  Chief Complaint  Patient presents with  . New Evaluation    anal cancer    INTERVAL HISTORY: Patient is a 70 year old male who was recently diagnosed with anal cancer. Initially, patient was having problems with constipation and hemorrhoids and found a mass in his rectum. 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 Patient otherwise feels well. He has no neurologic complaints.  He denies any recent fevers or illnesses. He has a good appetite and denies weight loss. He denies any chest pain or shortness of breath. He has no nausea, vomiting, or diarrhea. He has noted no melena or hematochezia. He has no urinary complaints. Patient otherwise feels well and offers no further specific complaints.  REVIEW OF  SYSTEMS:   Review of Systems  Constitutional: Negative.  Negative for fever and weight loss.  Respiratory: Negative.  Negative for cough and shortness of breath.   Cardiovascular: Negative.  Negative for chest pain.  Gastrointestinal: Positive for constipation. Negative for abdominal pain, blood in stool and melena.  Musculoskeletal: Negative.   Neurological: Negative.  Negative for weakness.    As per HPI. Otherwise, a complete review of systems is negatve.  PAST MEDICAL HISTORY: Past Medical History  Diagnosis Date  . Headache(784.0)   . HTN (hypertension)   . Hyperlipidemia   . High cholesterol   . Anxiety and depression   . Cancer ([place])   . Memory loss     PAST SURGICAL HISTORY: Past Surgical History  Procedure Laterality Date  . Colonoscopy with propofol N/A 09/13/2015    Procedure: COLONOSCOPY WITH PROPOFOL;  Surgeon: Hulen Luster, MD;  Location: [facility] ENDOSCOPY;   Service: Gastroenterology;  Laterality: N/A;    FAMILY HISTORY Family History  Problem Relation Age of Onset  . Cancer    . Cancer Other        ADVANCED DIRECTIVES:    HEALTH MAINTENANCE: Social History  Substance Use Topics  . Smoking status: Never Smoker   . Smokeless tobacco: Never Used  . Alcohol Use: No     Colonoscopy:  PAP:  Bone density:  Lipid panel:  Allergies  Allergen Reactions  . Sertraline Other (See Comments)    Makes skin crawl  . Topiramate Other (See Comments)    [place]    Current Outpatient Prescriptions  Medication Sig Dispense Refill  . amphetamine-dextroamphetamine (ADDERALL) 5 MG tablet Take 5 mg by mouth 2 (two) times daily.     . bisoprolol-hydrochlorothiazide (ZIAC) 2.5-6.25 MG per tablet Take 1 tablet by mouth 2 (two) times daily.     Marland Kitchen escitalopram (LEXAPRO) 10 MG tablet Take 10 mg by mouth daily.  1  . fluticasone (FLONASE) 50 MCG/ACT nasal spray Place 2 sprays into both nostrils daily as needed for rhinitis.     Marland Kitchen omeprazole (PRILOSEC) 40 MG capsule Take 40 mg by mouth every evening.     . potassium chloride (K-DUR,KLOR-CON) 10 MEQ tablet Take 10 mEq by mouth daily.    . sodium bicarbonate 650 MG tablet Take 1,300 mg by mouth every evening.     . zonisamide (ZONEGRAN) 25 MG capsule Take 50 mg by mouth at bedtime.      No current facility-administered medications for this visit.  OBJECTIVE: Filed Vitals:   09/22/15 1017  BP: 132/77  Pulse: 52  Temp: 97.1 F (36.2 C)  Resp: 18     Body mass index is 22.99 kg/(m^2).    ECOG FS:0 - Asymptomatic  General: Well-developed, well-nourished, no acute distress. Eyes: Pink conjunctiva, anicteric sclera. HEENT: Normocephalic, moist mucous membranes, clear oropharnyx. Lungs: Clear to auscultation bilaterally. Heart: Regular rate and rhythm. No rubs, murmurs, or gallops. Abdomen: Soft, nontender, nondistended. No organomegaly noted, normoactive bowel sounds. Musculoskeletal: No edema,  cyanosis, or clubbing. Neuro: Alert, answering all questions appropriately. Cranial nerves grossly intact. Skin: No rashes or petechiae noted. Psych: Normal affect. Lymphatics: No cervical, calvicular, axillary or inguinal LAD.   LAB RESULTS:  Lab Results  Component Value Date   NA 139 06/14/2015   K 4.7 06/14/2015   CL 106 06/14/2015   CO2 30 06/14/2015   GLUCOSE 114* 06/14/2015   BUN 18 06/14/2015   CREATININE 1.14 06/14/2015   CALCIUM 9.6 06/14/2015   PROT 7.3 06/14/2015   ALBUMIN 4.5 06/14/2015   AST 25 06/14/2015   ALT 22 06/14/2015   ALKPHOS 75 06/14/2015   BILITOT 1.0 06/14/2015   GFRNONAA >60 06/14/2015   GFRAA >60 06/14/2015    Lab Results  Component Value Date   WBC 4.8 06/14/2015   HGB 16.8 06/14/2015   HCT 49.7 06/14/2015   MCV 89.2 06/14/2015   PLT 156 06/14/2015     STUDIES: No results found.  ASSESSMENT: Squamous cell cancer of the anus   PLAN:    1. Anal cancer: Patient has an EUS scheduled for September 30, 2015 to determine depth of invasion and staging. Will also order a CT of the abdomen and pelvis to assess for local regional disease. Once imaging is complete, patient will return to clinic to discuss treatments options which possibly could include surgery, XRT, or concurrent XRT with 5-FU and mitomycin. Appreciate surgical and GI input. 2. Constipation: Continue current treatment as prescribed.  Approximately 45 minutes was spent in discussion of which greater than 50% was consultation.   Patient expressed understanding and was in agreement with this plan. He also understands that He can call clinic at any time with any questions, concerns, or complaints.    Lloyd Huger, MD   09/24/2015 2:30 PM

## 2015-09-27 ENCOUNTER — Ambulatory Visit
Admission: RE | Admit: 2015-09-27 | Discharge: 2015-09-27 | Disposition: A | Discharge status: Home / Self Care | Payer: Medicare Other | Source: Ambulatory Visit | Attending: Oncology | Admitting: Oncology

## 2015-09-27 DIAGNOSIS — C210 Malignant neoplasm of anus, unspecified: Secondary | ICD-10-CM | POA: Diagnosis not present

## 2015-09-27 DIAGNOSIS — K7689 Other specified diseases of liver: Secondary | ICD-10-CM | POA: Diagnosis not present

## 2015-09-27 LAB — POCT I-STAT CREATININE: Creatinine, Ser: 1.2 mg/dL (ref 0.61–1.24)

## 2015-09-27 MED ORDER — IOHEXOL 300 MG/ML  SOLN
100.0000 mL | Freq: Once | INTRAMUSCULAR | Status: AC | PRN
  Administered 2015-09-27: 100 mL via INTRAVENOUS

## 2015-09-30 ENCOUNTER — Ambulatory Visit: Payer: Medicare Other | Admitting: Certified Registered Nurse Anesthetist

## 2015-09-30 ENCOUNTER — Ambulatory Visit
Admission: RE | Admit: 2015-09-30 | Discharge: 2015-09-30 | Disposition: A | Discharge status: Home / Self Care | Payer: Medicare Other | Source: Ambulatory Visit | Attending: Internal Medicine | Admitting: Internal Medicine

## 2015-09-30 ENCOUNTER — Encounter
Admission: RE | Disposition: A | Discharge status: Home / Self Care | Payer: Self-pay | Source: Ambulatory Visit | Attending: Internal Medicine

## 2015-09-30 ENCOUNTER — Encounter: Payer: Self-pay | Admitting: *Deleted

## 2015-09-30 DIAGNOSIS — F419 Anxiety disorder, unspecified: Secondary | ICD-10-CM | POA: Insufficient documentation

## 2015-09-30 DIAGNOSIS — K219 Gastro-esophageal reflux disease without esophagitis: Secondary | ICD-10-CM | POA: Insufficient documentation

## 2015-09-30 DIAGNOSIS — F329 Major depressive disorder, single episode, unspecified: Secondary | ICD-10-CM | POA: Diagnosis not present

## 2015-09-30 DIAGNOSIS — E7800 Pure hypercholesterolemia, unspecified: Secondary | ICD-10-CM | POA: Insufficient documentation

## 2015-09-30 DIAGNOSIS — Z79899 Other long term (current) drug therapy: Secondary | ICD-10-CM | POA: Insufficient documentation

## 2015-09-30 DIAGNOSIS — I10 Essential (primary) hypertension: Secondary | ICD-10-CM | POA: Insufficient documentation

## 2015-09-30 DIAGNOSIS — C218 Malignant neoplasm of overlapping sites of rectum, anus and anal canal: Secondary | ICD-10-CM | POA: Insufficient documentation

## 2015-09-30 DIAGNOSIS — E785 Hyperlipidemia, unspecified: Secondary | ICD-10-CM | POA: Insufficient documentation

## 2015-09-30 DIAGNOSIS — Z7951 Long term (current) use of inhaled steroids: Secondary | ICD-10-CM | POA: Diagnosis not present

## 2015-09-30 DIAGNOSIS — C210 Malignant neoplasm of anus, unspecified: Secondary | ICD-10-CM | POA: Diagnosis present

## 2015-09-30 HISTORY — PX: EUS: SHX5427

## 2015-09-30 SURGERY — ULTRASOUND, LOWER GI TRACT, ENDOSCOPIC
Anesthesia: General

## 2015-09-30 MED ORDER — SODIUM CHLORIDE 0.9 % IV SOLN
INTRAVENOUS | Status: DC
  Administered 2015-09-30: 08:00:00 via INTRAVENOUS

## 2015-09-30 NOTE — Anesthesia Preprocedure Evaluation (Signed)
Anesthesia Evaluation  Patient identified by MRN, date of birth, ID band Patient awake    Reviewed: Allergy & Precautions, H&P , NPO status , Patient's Chart, lab work & pertinent test results, reviewed documented beta blocker date and time   Airway Mallampati: II   Neck ROM: full    Dental  (+) Teeth Intact   Pulmonary neg pulmonary ROS,    Pulmonary exam normal        Cardiovascular hypertension, Pt. on medications and Pt. on home beta blockers negative cardio ROS Normal cardiovascular exam     Neuro/Psych  Headaches, PSYCHIATRIC DISORDERS Depression negative neurological ROS  negative psych ROS   GI/Hepatic negative GI ROS, Neg liver ROS, GERD  Medicated and Controlled,Rectal CA   Endo/Other  negative endocrine ROS  Renal/GU negative Renal ROS  negative genitourinary   Musculoskeletal negative musculoskeletal ROS (+)   Abdominal   Peds negative pediatric ROS (+)  Hematology negative hematology ROS (+)   Anesthesia Other Findings Past Medical History:   Headache(784.0)                                              HTN (hypertension)                                           Hyperlipidemia                                               High cholesterol                                             Anxiety and depression                                       Cancer ([place])                                                 Memory loss                                                History reviewed. No pertinent surgical history.   Reproductive/Obstetrics                             Anesthesia Physical  Anesthesia Plan  ASA: II  Anesthesia Plan: General   Post-op Pain Management:    Induction: Intravenous  Airway Management Planned: Nasal Cannula  Additional Equipment:   Intra-op Plan:   Post-operative Plan:   Informed Consent: I have reviewed the patients History and Physical,  chart, labs and discussed the procedure including the risks, benefits and alternatives for  the proposed anesthesia with the patient or authorized representative who has indicated his/her understanding and acceptance.   Dental advisory given  Plan Discussed with: CRNA and Surgeon  Anesthesia Plan Comments:        Anesthesia Quick Evaluation

## 2015-09-30 NOTE — H&P (View-Only) (Signed)
[place]  [phone] [phone]) [phone]  ID: Brad Randall OB: 1946-01-03  MR#: [id number]  [id number]  Patient Care Team: Maryland Pink, MD as PCP - General (Family Medicine)  CHIEF COMPLAINT:  Chief Complaint  Patient presents with  . New Evaluation    anal cancer    INTERVAL HISTORY: Patient is a 70 year old male who was recently diagnosed with anal cancer. Initially, patient was having problems with constipation and hemorrhoids and found a mass in his rectum. 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 Patient otherwise feels well. He has no neurologic complaints.  He denies any recent fevers or illnesses. He has a good appetite and denies weight loss. He denies any chest pain or shortness of breath. He has no nausea, vomiting, or diarrhea. He has noted no melena or hematochezia. He has no urinary complaints. Patient otherwise feels well and offers no further specific complaints.  REVIEW OF  SYSTEMS:   Review of Systems  Constitutional: Negative.  Negative for fever and weight loss.  Respiratory: Negative.  Negative for cough and shortness of breath.   Cardiovascular: Negative.  Negative for chest pain.  Gastrointestinal: Positive for constipation. Negative for abdominal pain, blood in stool and melena.  Musculoskeletal: Negative.   Neurological: Negative.  Negative for weakness.    As per HPI. Otherwise, a complete review of systems is negatve.  PAST MEDICAL HISTORY: Past Medical History  Diagnosis Date  . Headache(784.0)   . HTN (hypertension)   . Hyperlipidemia   . High cholesterol   . Anxiety and depression   . Cancer ([place])   . Memory loss     PAST SURGICAL HISTORY: Past Surgical History  Procedure Laterality Date  . Colonoscopy with propofol N/A 09/13/2015    Procedure: COLONOSCOPY WITH PROPOFOL;  Surgeon: Hulen Luster, MD;  Location: [facility] ENDOSCOPY;   Service: Gastroenterology;  Laterality: N/A;    FAMILY HISTORY Family History  Problem Relation Age of Onset  . Cancer    . Cancer Other        ADVANCED DIRECTIVES:    HEALTH MAINTENANCE: Social History  Substance Use Topics  . Smoking status: Never Smoker   . Smokeless tobacco: Never Used  . Alcohol Use: No     Colonoscopy:  PAP:  Bone density:  Lipid panel:  Allergies  Allergen Reactions  . Sertraline Other (See Comments)    Makes skin crawl  . Topiramate Other (See Comments)    Mania    Current Outpatient Prescriptions  Medication Sig Dispense Refill  . amphetamine-dextroamphetamine (ADDERALL) 5 MG tablet Take 5 mg by mouth 2 (two) times daily.     . bisoprolol-hydrochlorothiazide (ZIAC) 2.5-6.25 MG per tablet Take 1 tablet by mouth 2 (two) times daily.     Marland Kitchen escitalopram (LEXAPRO) 10 MG tablet Take 10 mg by mouth daily.  1  . fluticasone (FLONASE) 50 MCG/ACT nasal spray Place 2 sprays into both nostrils daily as needed for rhinitis.     Marland Kitchen omeprazole (PRILOSEC) 40 MG capsule Take 40 mg by mouth every evening.     . potassium chloride (K-DUR,KLOR-CON) 10 MEQ tablet Take 10 mEq by mouth daily.    . sodium bicarbonate 650 MG tablet Take 1,300 mg by mouth every evening.     . zonisamide (ZONEGRAN) 25 MG capsule Take 50 mg by mouth at bedtime.      No current facility-administered medications for this visit.  OBJECTIVE: Filed Vitals:   09/22/15 1017  BP: 132/77  Pulse: 52  Temp: 97.1 F (36.2 C)  Resp: 18     Body mass index is 22.99 kg/(m^2).    ECOG FS:0 - Asymptomatic  General: Well-developed, well-nourished, no acute distress. Eyes: Pink conjunctiva, anicteric sclera. HEENT: Normocephalic, moist mucous membranes, clear oropharnyx. Lungs: Clear to auscultation bilaterally. Heart: Regular rate and rhythm. No rubs, murmurs, or gallops. Abdomen: Soft, nontender, nondistended. No organomegaly noted, normoactive bowel sounds. Musculoskeletal: No edema,  cyanosis, or clubbing. Neuro: Alert, answering all questions appropriately. Cranial nerves grossly intact. Skin: No rashes or petechiae noted. Psych: Normal affect. Lymphatics: No cervical, calvicular, axillary or inguinal LAD.   LAB RESULTS:  Lab Results  Component Value Date   NA 139 06/14/2015   K 4.7 06/14/2015   CL 106 06/14/2015   CO2 30 06/14/2015   GLUCOSE 114* 06/14/2015   BUN 18 06/14/2015   CREATININE 1.14 06/14/2015   CALCIUM 9.6 06/14/2015   PROT 7.3 06/14/2015   ALBUMIN 4.5 06/14/2015   AST 25 06/14/2015   ALT 22 06/14/2015   ALKPHOS 75 06/14/2015   BILITOT 1.0 06/14/2015   GFRNONAA >60 06/14/2015   GFRAA >60 06/14/2015    Lab Results  Component Value Date   WBC 4.8 06/14/2015   HGB 16.8 06/14/2015   HCT 49.7 06/14/2015   MCV 89.2 06/14/2015   PLT 156 06/14/2015     STUDIES: No results found.  ASSESSMENT: Squamous cell cancer of the anus   PLAN:    1. Anal cancer: Patient has an EUS scheduled for September 30, 2015 to determine depth of invasion and staging. Will also order a CT of the abdomen and pelvis to assess for local regional disease. Once imaging is complete, patient will return to clinic to discuss treatments options which possibly could include surgery, XRT, or concurrent XRT with 5-FU and mitomycin. Appreciate surgical and GI input. 2. Constipation: Continue current treatment as prescribed.  Approximately 45 minutes was spent in discussion of which greater than 50% was consultation.   Patient expressed understanding and was in agreement with this plan. He also understands that He can call clinic at any time with any questions, concerns, or complaints.    Lloyd Huger, MD   09/24/2015 2:30 PM

## 2015-09-30 NOTE — Interval H&P Note (Signed)
History and Physical Interval Note:  09/30/2015 7:45 AM  Brad Randall  has presented today for surgery, with the diagnosis of RECTAL CANCER  The various methods of treatment have been discussed with the patient and family. After consideration of risks, benefits and other options for treatment, the patient has consented to  Procedure(s): LOWER ENDOSCOPIC ULTRASOUND (EUS) (N/A) as a surgical intervention .  The patient's history has been reviewed, patient examined, no change in status, stable for surgery.  I have reviewed the patient's chart and labs.  Questions were answered to the patient's satisfaction.     Tillie Rung

## 2015-09-30 NOTE — Op Note (Signed)
[facility] Gastroenterology Patient Name: Brad Randall Procedure Date: 09/30/2015 8:10 AM MRN: [id number] Account #: [id number] Date of Birth: 24-Nov-1945 Admit Type: Outpatient Age: 70 Room: [facility] ENDO ROOM 3 Gender: Male Note Status: Finalized Procedure:            Lower EUS Indications:          Pre-treatment staging for anorectal carcinoma (squamous                        cell carcinoma) Patient Profile:      Refer to note in patient chart for documentation of                        history and physical. Providers:            Murray Hodgkins. [place] Referring MD:         Docia Chuck. Candace Cruise, MD (Referring MD), Colbert Coyer                        (Referring MD) Medicines:            None Complications:        No immediate complications. Procedure:            Pre-Anesthesia Assessment:                       Prior to the procedure, a History and Physical was                        performed, and patient medications and allergies were                        reviewed. The patient is competent. The risks and                        benefits of the procedure and the sedation options and                        risks were discussed with the patient. All questions                        were answered and informed consent was obtained.                        Patient identification and proposed procedure were                        verified by the physician and the nurse in the                        pre-procedure area. Mental Status Examination: alert                        and oriented. Airway Examination: normal oropharyngeal                        airway and neck mobility. Respiratory Examination:                        clear to auscultation. CV Examination: normal.  Prophylactic Antibiotics: The patient does not require                        prophylactic antibiotics. Prior Anticoagulants: The                        patient has taken no previous  anticoagulant or                        antiplatelet agents. ASA Grade Assessment: II - A                        patient with mild systemic disease. After reviewing the                        risks and benefits, the patient was deemed in                        satisfactory condition to undergo the procedure. The                        anesthesia plan was to use no sedation or anesthesia.                        Immediately prior to administration of medications, the                        patient was re-assessed for adequacy to receive                        sedatives. The heart rate, respiratory rate, oxygen                        saturations, blood pressure, adequacy of pulmonary                        ventilation, and response to care were monitored                        throughout the procedure. The physical status of the                        patient was re-assessed after the procedure.                       After obtaining informed consent, the endoscope was                        passed under direct vision. Throughout the procedure,                        the patient's blood pressure, pulse, and oxygen                        saturations were monitored continuously. The lower EUS                        was accomplished without difficulty. The patient  tolerated the procedure well. The quality of the bowel                        preparation was good. The Endoscope was introduced                        through the anus and advanced to the sigmoid colon. The                        EUS GI Radial Array [id number] was introduced through the                        anus and advanced to the sigmoid colon for ultrasound.                        Images were not able to be captured due to a computer                        interface malfunction. Findings:      The digital rectal exam revealed a firm rectal mass. The mass was       non-circumferential and located predominantly  at the right-anterior       bowel wall.      Endoscopic Finding :      An infiltrative, polypoid non-obstructing medium-sized mass was found in       the distal rectum at the level of the dentate line. The mass was       non-circumferential. The mass measured ~1-2 cm in length. The diameter       measured ~18 mm.      The proximal rectum, recto-sigmoid colon and sigmoid colon appeared       normal.      Endosonographic Finding :      A hypoechoic mass was found in the rectum. The mass was encountered at       the level of the dentate line. The mass was non-circumferential and       located predominantly at the right-anterior rectal wall. The       endosonographic borders were well-defined. There was sonographic       evidence suggesting invasion into the muscularis propria (Layer 4)       without extension into the serosa.      The perirectal space was normal.      No lymph nodes were seen in the perirectal region and in the left iliac       region.      The anal canal was normal. Impression:           Flexible Sigmoidoscopy Impressions:                       - Mass in the distal rectum at the level of the dentate                        line. Consistent with known squamous cell carcinoma.                       - The proximal rectum, recto-sigmoid colon and sigmoid                        colon are normal.  Rectal EUS Impressions:                       - Rectal mass was visualized endosonographically. A                        tissue diagnosis was obtained prior to this exam. This                        is consistent with squamous cell carcinoma. This was                        staged uT2 uN0.                       - Endosonographic images of the perirectal space were                        unremarkable.                       - No lymph nodes were seen during endosonographic                        examination.                       - Endosonographic images of the  anal canal were                        unremarkable.                       - No specimens collected. Recommendation:       - Discharge patient to home (ambulatory).                       - The findings and recommendations were discussed with                        the patient and his family.                       - Return to referring surgeon as previously scheduled.                        Further plan of care to be determined by the referring                        surgeon. Attending Participation:      I personally performed the entire procedure without the assistance of a       fellow, resident or surgical assistant. [place],  09/30/2015 8:21:31 AM This report has been signed electronically. Number of Addenda: 0 Note Initiated On: 09/30/2015 8:10 AM      [facility]

## 2015-10-04 ENCOUNTER — Inpatient Hospital Stay: Payer: Medicare Other | Attending: Oncology | Admitting: Oncology

## 2015-10-04 VITALS — BP 166/93 | HR 56 | Temp 98.1°F | Resp 16 | Ht 69.49 in | Wt 158.5 lb

## 2015-10-04 DIAGNOSIS — F329 Major depressive disorder, single episode, unspecified: Secondary | ICD-10-CM | POA: Diagnosis not present

## 2015-10-04 DIAGNOSIS — F419 Anxiety disorder, unspecified: Secondary | ICD-10-CM | POA: Insufficient documentation

## 2015-10-04 DIAGNOSIS — C210 Malignant neoplasm of anus, unspecified: Secondary | ICD-10-CM | POA: Diagnosis present

## 2015-10-04 DIAGNOSIS — E7800 Pure hypercholesterolemia, unspecified: Secondary | ICD-10-CM | POA: Diagnosis not present

## 2015-10-04 DIAGNOSIS — I10 Essential (primary) hypertension: Secondary | ICD-10-CM | POA: Diagnosis not present

## 2015-10-04 DIAGNOSIS — K7689 Other specified diseases of liver: Secondary | ICD-10-CM | POA: Insufficient documentation

## 2015-10-04 DIAGNOSIS — Z79899 Other long term (current) drug therapy: Secondary | ICD-10-CM | POA: Diagnosis not present

## 2015-10-04 DIAGNOSIS — K5900 Constipation, unspecified: Secondary | ICD-10-CM | POA: Diagnosis not present

## 2015-10-04 NOTE — Patient Instructions (Signed)
Mitomycin injection What is this medicine? MITOMYCIN (mye toe MYE sin) is a chemotherapy drug. This medicine is used to treat cancer of the stomach and pancreas. This medicine may be used for other purposes; ask your health care provider or pharmacist if you have questions. What should I tell my health care provider before I take this medicine? They need to know if you have any of these conditions: -anemia -bleeding disorder -infection (especially a virus infection such as chickenpox, cold sores, or herpes) -kidney disease -low blood counts like low platelets, red blood cells, white blood cells -recent radiation therapy -an unusual or allergic reaction to mitomycin, other chemotherapy agents, other medicines, foods, dyes, or preservatives -pregnant or trying to get pregnant -breast-feeding How should I use this medicine? This drug is given as an injection or infusion into a vein. It is administered in a hospital or clinic by a specially trained health care professional. Talk to your pediatrician regarding the use of this medicine in children. Special care may be needed. Overdosage: If you think you have taken too much of this medicine contact a poison control center or emergency room at once. NOTE: This medicine is only for you. Do not share this medicine with others. What if I miss a dose? It is important not to miss your dose. Call your doctor or health care professional if you are unable to keep an appointment. What may interact with this medicine? -medicines to increase blood counts like filgrastim, pegfilgrastim, sargramostim -vaccines This list may not describe all possible interactions. Give your health care provider a list of all the medicines, herbs, non-prescription drugs, or dietary supplements you use. Also tell them if you smoke, drink alcohol, or use illegal drugs. Some items may interact with your medicine. What should I watch for while using this medicine? Your condition  will be monitored carefully while you are receiving this medicine. You will need important blood work done while you are taking this medicine. This drug may make you feel generally unwell. This is not uncommon, as chemotherapy can affect healthy cells as well as cancer cells. Report any side effects. Continue your course of treatment even though you feel ill unless your doctor tells you to stop. Call your doctor or health care professional for advice if you get a fever, chills or sore throat, or other symptoms of a cold or flu. Do not treat yourself. This drug decreases your body's ability to fight infections. Try to avoid being around people who are sick. This medicine may increase your risk to bruise or bleed. Call your doctor or health care professional if you notice any unusual bleeding. Be careful brushing and flossing your teeth or using a toothpick because you may get an infection or bleed more easily. If you have any dental work done, tell your dentist you are receiving this medicine. Avoid taking products that contain aspirin, acetaminophen, ibuprofen, naproxen, or ketoprofen unless instructed by your doctor. These medicines may hide a fever. Do not become pregnant while taking this medicine. Women should inform their doctor if they wish to become pregnant or think they might be pregnant. There is a potential for serious side effects to an unborn child. Talk to your health care professional or pharmacist for more information. Do not breast-feed an infant while taking this medicine. What side effects may I notice from receiving this medicine? Side effects that you should report to your doctor or health care professional as soon as possible: -allergic reactions like skin rash, itching   or hives, swelling of the face, lips, or tongue -low blood counts - this medicine may decrease the number of white blood cells, red blood cells and platelets. You may be at increased risk for infections and  bleeding. -signs of infection - fever or chills, cough, sore throat, pain or difficulty passing urine -signs of decreased platelets or bleeding - bruising, pinpoint red spots on the skin, black, tarry stools, blood in the urine -signs of decreased red blood cells - unusually weak or tired, fainting spells, lightheadedness -breathing problems -changes in vision -chest pain -confusion -dry cough -high blood pressure -mouth sores -pain, swelling, redness at site where injected -pain, tingling, numbness in the hands or feet -seizures -swelling of the ankles, feet, hands -trouble passing urine or change in the amount of urine Side effects that usually do not require medical attention (report to your doctor or health care professional if they continue or are bothersome): -diarrhea -green to blue color of urine -hair loss -loss of appetite -nausea, vomiting This list may not describe all possible side effects. Call your doctor for medical advice about side effects. You may report side effects to FDA at 1-800-FDA-1088. Where should I keep my medicine? This drug is given in a hospital or clinic and will not be stored at home. NOTE: This sheet is a summary. It may not cover all possible information. If you have questions about this medicine, talk to your doctor, pharmacist, or health care provider.    2016, Elsevier/Gold Standard. (2008-01-23 11:16:23)  

## 2015-10-04 NOTE — Progress Notes (Signed)
Patient is here to discuss test results.

## 2015-10-06 ENCOUNTER — Other Ambulatory Visit: Payer: Self-pay | Admitting: *Deleted

## 2015-10-06 ENCOUNTER — Ambulatory Visit
Admission: RE | Admit: 2015-10-06 | Discharge: 2015-10-06 | Disposition: A | Discharge status: Home / Self Care | Payer: Medicare Other | Source: Ambulatory Visit | Attending: Radiation Oncology | Admitting: Radiation Oncology

## 2015-10-06 ENCOUNTER — Encounter: Payer: Self-pay | Admitting: Radiation Oncology

## 2015-10-06 VITALS — BP 149/87 | HR 72 | Temp 96.4°F | Resp 20 | Wt 158.6 lb

## 2015-10-06 DIAGNOSIS — C210 Malignant neoplasm of anus, unspecified: Secondary | ICD-10-CM | POA: Insufficient documentation

## 2015-10-06 DIAGNOSIS — Z510 Encounter for antineoplastic radiation therapy: Secondary | ICD-10-CM | POA: Insufficient documentation

## 2015-10-06 MED ORDER — CAPECITABINE 500 MG PO TABS: 825.0000 mg/m2 | ORAL_TABLET | Freq: Two times a day (BID) | ORAL | Status: DC

## 2015-10-06 MED ORDER — PROCHLORPERAZINE MALEATE 10 MG PO TABS: 10.0000 mg | ORAL_TABLET | Freq: Four times a day (QID) | ORAL | Status: DC | PRN

## 2015-10-06 NOTE — Consult Note (Signed)
Except an outstanding is perfect of Radiation Oncology NEW PATIENT EVALUATION  Name: Brad Randall  MRN: [id number]  Date:   10/06/2015     DOB: 1945/12/28   This 70 y.o. 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  REFERRING PHYSICIAN: Maryland Pink, MD  CHIEF COMPLAINT:  Chief Complaint  Patient presents with  . Cancer    Pt is here for initial consultation of anal cancer.      DIAGNOSIS: The encounter diagnosis was Anal cancer ([place]).   PREVIOUS INVESTIGATIONS:  CT scans reviewed PET CT scan ordered Clinical notes as well as procedure notes reviewed Pathology report reviewed  HPI: Patient is a 70 year old male with several year history of anal itching and bleeding associated with constipation and a history of hemorrhoids. He self discovered mass in his rectum which led to a colonoscopy and biopsy positive for squamous cell carcinoma. Endoscopic ultrasound showed a mass in the distal rectum at the dentate line which was hypoechoic and showed invasion to the muscularis propria making this a T2 lesion. No lymph node invasion or adenopathy was identified. He has been seen by medical oncology is now referred to radiation oncology for opinion. CT scan did not demonstrate any evidence of pelvic or inguinal adenopathy. Patient is doing fairly well at this time no significant bleeding or pain on defecation is noted this at this time.  PLANNED TREATMENT REGIMEN: Concurrent chemoradiation  PAST MEDICAL HISTORY:  has a past medical history of Headache(784.0); HTN (hypertension); Hyperlipidemia; High cholesterol; Anxiety and depression; Cancer [facility]); and Memory loss.    PAST SURGICAL HISTORY:  Past Surgical History  Procedure Laterality Date  . Colonoscopy with propofol N/A 09/13/2015    Procedure: COLONOSCOPY WITH PROPOFOL;  Surgeon: Hulen Luster, MD;  Location: [facility] ENDOSCOPY;  Service: Gastroenterology;  Laterality:  N/A;  . Eus N/A 09/30/2015    Procedure: LOWER ENDOSCOPIC ULTRASOUND (EUS);  Surgeon: Holly Bodily, MD;  Location: [facility] ENDOSCOPY;  Service: Gastroenterology;  Laterality: N/A;    FAMILY HISTORY: family history includes Cancer in his other.  SOCIAL HISTORY:  reports that he has never smoked. He has never used smokeless tobacco. He reports that he does not drink alcohol or use illicit drugs.  ALLERGIES: Sertraline and Topiramate  MEDICATIONS:  Current Outpatient Prescriptions  Medication Sig Dispense Refill  . amphetamine-dextroamphetamine (ADDERALL) 5 MG tablet Take 5 mg by mouth 2 (two) times daily.     . bisoprolol-hydrochlorothiazide (ZIAC) 2.5-6.25 MG per tablet Take 1 tablet by mouth 2 (two) times daily.     Marland Kitchen escitalopram (LEXAPRO) 10 MG tablet Take 10 mg by mouth daily.  1  . fluticasone (FLONASE) 50 MCG/ACT nasal spray Place 2 sprays into both nostrils daily as needed for rhinitis.     Marland Kitchen omeprazole (PRILOSEC) 40 MG capsule Take 40 mg by mouth every evening.     . potassium chloride (K-DUR,KLOR-CON) 10 MEQ tablet Take 10 mEq by mouth daily.    . sodium bicarbonate 650 MG tablet Take 1,300 mg by mouth every evening.     . zonisamide (ZONEGRAN) 25 MG capsule Take 50 mg by mouth at bedtime.      No current facility-administered medications for this encounter.    ECOG PERFORMANCE STATUS:  1 - Symptomatic but completely ambulatory  REVIEW OF SYSTEMS: Except for the itching and occasional rectal bleeding macro review of systems   PHYSICAL EXAM: BP 149/87 mmHg  Pulse  72  Temp(Src) 96.4 F (35.8 C)  Resp 20  Wt 158 lb 9.9 oz (71.95 kg) On rectal exam there is a firm mass present at the distal rectal anal canal. No evidence of inguinal adenopathy is palpated. Well-developed well-nourished patient in NAD. HEENT reveals PERLA, EOMI, discs not visualized.  Oral cavity is clear. No oral mucosal lesions are identified. Neck is clear without evidence of cervical or supraclavicular  adenopathy. Lungs are clear to A&P. Cardiac examination is essentially unremarkable with regular rate and rhythm without murmur rub or thrill. Abdomen is benign with no organomegaly or masses noted. Motor sensory and DTR levels are equal and symmetric in the upper and lower extremities. Cranial nerves II through XII are grossly intact. Proprioception is intact. No peripheral adenopathy or edema is identified. No motor or sensory levels are noted. Crude visual fields are within normal range.  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  PLAN: At this time of ordered a PET CT scan to complete his staging and to determine whether inguinal nodal involvement is possible. [place] and guidelines recommend I am RT treatment planning and delivery with concurrent chemotherapy with curative intent for this stage disease. I would treat his anus to 5400 cGy remaining lymph nodes to 4500 cGy including inguinal nodes if there is no evidence of gross tumor involvement. I would use I'm RT treatment planning and delivery. Risks and benefits of treatment including diarrhea fatigue increase of lower urinary tract symptoms, skin reaction, alteration of blood counts all were described in detail to the patient and his wife. Both seem to comprehend my treatment plan well. I have set up and ordered CT simulation after his PET/CT scan. We will coordinate his chemotherapy with medical oncology.  I would like to take this opportunity for allowing me to participate in the care of your patient.Armstead Peaks., MD

## 2015-10-08 ENCOUNTER — Inpatient Hospital Stay: Payer: Medicare Other

## 2015-10-08 ENCOUNTER — Ambulatory Visit
Admission: RE | Admit: 2015-10-08 | Discharge: 2015-10-08 | Disposition: A | Discharge status: Home / Self Care | Payer: Medicare Other | Source: Ambulatory Visit | Attending: Radiation Oncology | Admitting: Radiation Oncology

## 2015-10-08 DIAGNOSIS — C210 Malignant neoplasm of anus, unspecified: Secondary | ICD-10-CM | POA: Diagnosis present

## 2015-10-08 LAB — GLUCOSE, CAPILLARY: GLUCOSE-CAPILLARY: 89 mg/dL (ref 65–99)

## 2015-10-08 MED ORDER — FLUDEOXYGLUCOSE F - 18 (FDG) INJECTION
13.0300 | Freq: Once | INTRAVENOUS | Status: AC | PRN
  Administered 2015-10-08: 13.03 via INTRAVENOUS

## 2015-10-10 NOTE — Progress Notes (Signed)
Brad Randall  [phone]) [phone] Fax:(336) [phone]  ID: Brad Randall OB: Sep 28, 1945  MR#: [id number]  [id number]  Patient Care Team: Maryland Pink, MD as PCP - General (Family Medicine)  CHIEF COMPLAINT:  Chief Complaint  Patient presents with  . anal cancer    INTERVAL HISTORY: Patient returns to clinic today to discuss his EUS results and treatment planning. He currently feels well and is asymptomatic. He has no neurologic complaints.  He denies any recent fevers or illnesses. He has a good appetite and denies weight loss. He denies any chest pain or shortness of breath. He has no nausea, vomiting, or diarrhea. He has noted no melena or hematochezia. He has no urinary complaints. Patient offers no further specific complaints.  REVIEW OF  SYSTEMS:   Review of Systems  Constitutional: Negative.  Negative for fever and weight loss.  Respiratory: Negative.  Negative for cough and shortness of breath.   Cardiovascular: Negative.  Negative for chest pain.  Gastrointestinal: Positive for constipation. Negative for abdominal pain, blood in stool and melena.  Musculoskeletal: Negative.   Neurological: Negative.  Negative for weakness.    As per HPI. Otherwise, a complete review of systems is negatve.  PAST MEDICAL HISTORY: Past Medical History  Diagnosis Date  . Headache(784.0)   . HTN (hypertension)   . Hyperlipidemia   . High cholesterol   . Anxiety and depression   . Cancer ([place])   . Memory loss     PAST SURGICAL HISTORY: Past Surgical History  Procedure Laterality Date  . Colonoscopy with propofol N/A 09/13/2015    Procedure: COLONOSCOPY WITH PROPOFOL;  Surgeon: Hulen Luster, MD;  Location: [facility] ENDOSCOPY;  Service: Gastroenterology;  Laterality: N/A;  . Eus N/A 09/30/2015    Procedure: LOWER ENDOSCOPIC ULTRASOUND (EUS);  Surgeon: Holly Bodily, MD;  Location: [facility] ENDOSCOPY;  Service: Gastroenterology;  Laterality: N/A;    FAMILY  HISTORY Family History  Problem Relation Age of Onset  . Cancer    . Cancer Other        ADVANCED DIRECTIVES:    HEALTH MAINTENANCE: Social History  Substance Use Topics  . Smoking status: Never Smoker   . Smokeless tobacco: Never Used  . Alcohol Use: No     Colonoscopy:  PAP:  Bone density:  Lipid panel:  Allergies  Allergen Reactions  . Sertraline Other (See Comments)    Makes skin crawl  . Topiramate Other (See Comments)    Mania    Current Outpatient Prescriptions  Medication Sig Dispense Refill  . amphetamine-dextroamphetamine (ADDERALL) 5 MG tablet Take 5 mg by mouth 2 (two) times daily.     . bisoprolol-hydrochlorothiazide (ZIAC) 2.5-6.25 MG per tablet Take 1 tablet by mouth 2 (two) times daily.     Marland Kitchen escitalopram (LEXAPRO) 10 MG tablet Take 10 mg by mouth daily.  1  . fluticasone (FLONASE) 50 MCG/ACT nasal spray Place 2 sprays into both nostrils daily as needed for rhinitis.     Marland Kitchen omeprazole (PRILOSEC) 40 MG capsule Take 40 mg by mouth every evening.     . potassium chloride (K-DUR,KLOR-CON) 10 MEQ tablet Take 10 mEq by mouth daily.    . sodium bicarbonate 650 MG tablet Take 1,300 mg by mouth every evening.     . zonisamide (ZONEGRAN) 25 MG capsule Take 50 mg by mouth at bedtime.     . capecitabine (XELODA) 500 MG tablet Take 3 tablets (1,500 mg total) by mouth 2 (two) times daily after a  meal. 120 tablet 0  . prochlorperazine (COMPAZINE) 10 MG tablet Take 1 tablet (10 mg total) by mouth every 6 (six) hours as needed (Nausea or vomiting). 30 tablet 1   No current facility-administered medications for this visit.    OBJECTIVE: Filed Vitals:   10/04/15 0936  BP: 166/93  Pulse: 56  Temp: 98.1 F (36.7 C)  Resp: 16     Body mass index is 23.08 kg/(m^2).    ECOG FS:0 - Asymptomatic  General: Well-developed, well-nourished, no acute distress. Eyes: Pink conjunctiva, anicteric sclera. Lungs: Clear to auscultation bilaterally. Heart: Regular rate and  rhythm. No rubs, murmurs, or gallops. Abdomen: Soft, nontender, nondistended. No organomegaly noted, normoactive bowel sounds. Musculoskeletal: No edema, cyanosis, or clubbing. Neuro: Alert, answering all questions appropriately. Cranial nerves grossly intact. Skin: No rashes or petechiae noted. Psych: Normal affect.   LAB RESULTS:  Lab Results  Component Value Date   NA 139 06/14/2015   K 4.7 06/14/2015   CL 106 06/14/2015   CO2 30 06/14/2015   GLUCOSE 114* 06/14/2015   BUN 18 06/14/2015   CREATININE 1.20 09/27/2015   CALCIUM 9.6 06/14/2015   PROT 7.3 06/14/2015   ALBUMIN 4.5 06/14/2015   AST 25 06/14/2015   ALT 22 06/14/2015   ALKPHOS 75 06/14/2015   BILITOT 1.0 06/14/2015   GFRNONAA >60 06/14/2015   GFRAA >60 06/14/2015    Lab Results  Component Value Date   WBC 4.8 06/14/2015   HGB 16.8 06/14/2015   HCT 49.7 06/14/2015   MCV 89.2 06/14/2015   PLT 156 06/14/2015     STUDIES: Ct Abdomen Pelvis W Contrast  09/27/2015  CLINICAL DATA:  Newly diagnosed anal squamous cell carcinoma. Initial staging. EXAM: CT ABDOMEN AND PELVIS WITH CONTRAST TECHNIQUE: Multidetector CT imaging of the abdomen and pelvis was performed using the standard protocol following bolus administration of intravenous contrast. CONTRAST:  137mL OMNIPAQUE IOHEXOL 300 MG/ML  SOLN COMPARISON:  11/15/2005 FINDINGS: Lower chest:  No acute findings. Hepatobiliary: Scattered tiny hepatic cysts are seen in both right and left lobes which remain stable. No liver masses are identified. Gallbladder is unremarkable. Pancreas: No mass, inflammatory changes, or other significant abnormality. Spleen: Within normal limits in size and appearance. Adrenals/Urinary Tract: No masses identified. No evidence of hydronephrosis. Stomach/Bowel: There is a 2.2 cm intraluminal polypoid soft tissue density along the anterior wall of the anal canal (image 81/series 2), consistent with recently diagnosed anal carcinoma. No evidence of  invasion of adjacent structures. No pathologically enlarged perianal or perirectal lymph nodes are identified. No evidence of obstruction, inflammatory process, or abnormal fluid collections. Vascular/Lymphatic: No pathologically enlarged lymph nodes. No evidence of abdominal aortic aneurysm. Reproductive: No mass or other significant abnormality. Other: None. Musculoskeletal:  No suspicious bone lesions identified. IMPRESSION: 2.2 cm intraluminal polypoid mass along the anterior anal wall, consistent with newly diagnosed anal carcinoma. No evidence of local or distant metastatic disease. Stable small hepatic cysts. Electronically Signed   By: Earle Gell M.D.   On: 09/27/2015 10:36   Nm Pet Image Initial (pi) Skull Base To Thigh  10/08/2015  CLINICAL DATA:  Initial treatment strategy for anal carcinoma. EXAM: NUCLEAR MEDICINE PET SKULL BASE TO THIGH TECHNIQUE: 13.0 mCi F-18 FDG was injected intravenously. Full-ring PET imaging was performed from the skull base to thigh after the radiotracer. CT data was obtained and used for attenuation correction and anatomic localization. FASTING BLOOD GLUCOSE:  Value: 89 mg/dl COMPARISON:  CT on 09/27/2015 FINDINGS: NECK No hypermetabolic lymph  nodes in the neck. CHEST No hypermetabolic mediastinal or hilar nodes. No suspicious pulmonary nodules on the CT scan. ABDOMEN/PELVIS No abnormal hypermetabolic activity within the liver, pancreas, adrenal glands, or spleen. Small benign left hepatic lobe cyst is seen in the lateral segment adjacent gallbladder fossa which shows no hypermetabolic activity. No hypermetabolic lymph nodes in the abdomen or pelvis. A hypermetabolic mass is seen within the anus. This has an SUV max of 14.1, consistent with known anal carcinoma. There is also some hypermetabolic activity seen corresponding to nodular wall thickening involving the lower right lateral rectal wall with SUV max of 4.3, suspicious for local extension through muscularis propria.  In addition, there is a 6 mm left posterior perirectal lymph node at approximately the 4 o'clock position Image 223 of series 3, which has SUV max of 2.5. SKELETON No focal hypermetabolic activity to suggest skeletal metastasis. IMPRESSION: Hypermetabolic mass within the anus, consistent with known anal carcinoma. Nodular hypermetabolic wall thickening along the right lower lateral rectal wall is suspicious for local extension through muscularis propria. Hypermetabolic 6 mm left posterior perirectal lymph node is consistent with local lymph node metastasis. No evidence of distant metastatic disease. Electronically Signed   By: Earle Gell M.D.   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 Anal cancer: EUS, CT, and PET scan results reviewed independently confirming local disease without distant metastasis. Patient will not require surgery at this time. He also had consultation with radiation oncology today. Will proceed with concurrent chemotherapy and XRT using Xeloda 825 mg/m twice a day Monday through Friday on each day that XRT is given. This will amount to approximately 28 treatment days. Patient will also see received IV mitomycin 10 mg/m on days 1 and 29. Return to clinic in approximately 2 weeks to initiate day 1 of treatment.  2. Constipation: Continue current treatment as prescribed. 3. Elevated blood pressure: Monitor.  Approximately 30 minutes was spent in discussion of which greater than 50% was consultation.   Patient expressed understanding and was in agreement with this plan. He also understands that He can call clinic at any time with any questions, concerns, or complaints.    Lloyd Huger, MD   10/10/2015 8:25 AM

## 2015-10-11 ENCOUNTER — Ambulatory Visit
Admission: RE | Admit: 2015-10-11 | Discharge: 2015-10-11 | Disposition: A | Discharge status: Home / Self Care | Payer: Medicare Other | Source: Ambulatory Visit | Attending: Radiation Oncology | Admitting: Radiation Oncology

## 2015-10-11 DIAGNOSIS — C210 Malignant neoplasm of anus, unspecified: Secondary | ICD-10-CM | POA: Diagnosis not present

## 2015-10-11 DIAGNOSIS — Z510 Encounter for antineoplastic radiation therapy: Secondary | ICD-10-CM | POA: Diagnosis not present

## 2015-10-15 DIAGNOSIS — Z510 Encounter for antineoplastic radiation therapy: Secondary | ICD-10-CM | POA: Diagnosis not present

## 2015-10-18 ENCOUNTER — Other Ambulatory Visit: Payer: Self-pay | Admitting: *Deleted

## 2015-10-18 DIAGNOSIS — C210 Malignant neoplasm of anus, unspecified: Secondary | ICD-10-CM

## 2015-10-19 DIAGNOSIS — Z510 Encounter for antineoplastic radiation therapy: Secondary | ICD-10-CM | POA: Diagnosis not present

## 2015-10-20 ENCOUNTER — Ambulatory Visit
Admission: RE | Admit: 2015-10-20 | Discharge: 2015-10-20 | Disposition: A | Discharge status: Home / Self Care | Payer: Medicare Other | Source: Ambulatory Visit | Attending: Radiation Oncology | Admitting: Radiation Oncology

## 2015-10-21 ENCOUNTER — Inpatient Hospital Stay: Payer: Medicare Other

## 2015-10-21 ENCOUNTER — Ambulatory Visit
Admission: RE | Admit: 2015-10-21 | Discharge: 2015-10-21 | Disposition: A | Discharge status: Home / Self Care | Payer: Medicare Other | Source: Ambulatory Visit | Attending: Radiation Oncology | Admitting: Radiation Oncology

## 2015-10-21 ENCOUNTER — Inpatient Hospital Stay (HOSPITAL_BASED_OUTPATIENT_CLINIC_OR_DEPARTMENT_OTHER): Payer: Medicare Other | Admitting: Oncology

## 2015-10-21 VITALS — BP 146/85 | HR 56 | Temp 98.1°F

## 2015-10-21 VITALS — BP 160/83 | HR 53 | Temp 98.3°F | Resp 16 | Wt 157.2 lb

## 2015-10-21 DIAGNOSIS — F419 Anxiety disorder, unspecified: Secondary | ICD-10-CM

## 2015-10-21 DIAGNOSIS — C210 Malignant neoplasm of anus, unspecified: Secondary | ICD-10-CM

## 2015-10-21 DIAGNOSIS — Z79899 Other long term (current) drug therapy: Secondary | ICD-10-CM | POA: Diagnosis not present

## 2015-10-21 DIAGNOSIS — I10 Essential (primary) hypertension: Secondary | ICD-10-CM

## 2015-10-21 DIAGNOSIS — Z510 Encounter for antineoplastic radiation therapy: Secondary | ICD-10-CM | POA: Diagnosis not present

## 2015-10-21 DIAGNOSIS — K7689 Other specified diseases of liver: Secondary | ICD-10-CM | POA: Diagnosis not present

## 2015-10-21 DIAGNOSIS — F329 Major depressive disorder, single episode, unspecified: Secondary | ICD-10-CM

## 2015-10-21 DIAGNOSIS — E7800 Pure hypercholesterolemia, unspecified: Secondary | ICD-10-CM

## 2015-10-21 LAB — COMPREHENSIVE METABOLIC PANEL
ALBUMIN: 4.3 g/dL (ref 3.5–5.0)
ALT: 19 U/L (ref 17–63)
AST: 22 U/L (ref 15–41)
Alkaline Phosphatase: 78 U/L (ref 38–126)
Anion gap: 5 (ref 5–15)
BILIRUBIN TOTAL: 0.9 mg/dL (ref 0.3–1.2)
BUN: 18 mg/dL (ref 6–20)
CHLORIDE: 102 mmol/L (ref 101–111)
CO2: 29 mmol/L (ref 22–32)
Calcium: 9.1 mg/dL (ref 8.9–10.3)
Creatinine, Ser: 1.27 mg/dL — ABNORMAL HIGH (ref 0.61–1.24)
GFR calc Af Amer: >60 mL/min (ref >60)
GFR calc non Af Amer: 57 mL/min — ABNORMAL LOW (ref >60)
GLUCOSE: 145 mg/dL — AB (ref 65–99)
POTASSIUM: 4.3 mmol/L (ref 3.5–5.1)
SODIUM: 136 mmol/L (ref 135–145)
Total Protein: 7.1 g/dL (ref 6.5–8.1)

## 2015-10-21 LAB — CBC WITH DIFFERENTIAL/PLATELET
BASOS ABS: 0.1 10*3/uL (ref 0–0.1)
BASOS PCT: 1 %
Eosinophils Absolute: 0.2 10*3/uL (ref 0–0.7)
Eosinophils Relative: 3 %
HEMATOCRIT: 47.7 % (ref 40.0–52.0)
Hemoglobin: 16.6 g/dL (ref 13.0–18.0)
Lymphocytes Relative: 19 %
Lymphs Abs: 0.9 10*3/uL — ABNORMAL LOW (ref 1.0–3.6)
MCH: 30.5 pg (ref 26.0–34.0)
MCHC: 34.9 g/dL (ref 32.0–36.0)
MCV: 87.6 fL (ref 80.0–100.0)
MONO ABS: 0.4 10*3/uL (ref 0.2–1.0)
Monocytes Relative: 9 %
NEUTROS ABS: 3.4 10*3/uL (ref 1.4–6.5)
NEUTROS PCT: 68 %
PLATELETS: 149 10*3/uL — AB (ref 150–440)
RBC: 5.45 MIL/uL (ref 4.40–5.90)
RDW: 13.1 % (ref 11.5–14.5)
WBC: 4.9 10*3/uL (ref 3.8–10.6)

## 2015-10-21 MED ORDER — PROCHLORPERAZINE MALEATE 10 MG PO TABS
10.0000 mg | ORAL_TABLET | Freq: Once | ORAL | Status: AC
  Administered 2015-10-21: 10 mg via ORAL
  Filled 2015-10-21: qty 1

## 2015-10-21 MED ORDER — SODIUM CHLORIDE 0.9 % IV SOLN
Freq: Once | INTRAVENOUS | Status: AC
  Administered 2015-10-21: 12:00:00 via INTRAVENOUS
  Filled 2015-10-21: qty 1000

## 2015-10-21 MED ORDER — MITOMYCIN CHEMO IV INJECTION 20 MG
10.0000 mg/m2 | Freq: Once | INTRAVENOUS | Status: AC
  Administered 2015-10-21: 19 mg via INTRAVENOUS
  Filled 2015-10-21: qty 38

## 2015-10-21 NOTE — Progress Notes (Signed)
.   Anal cancer: EUS, CT, and PET scan results reviewed independently confirming local disease without distant metastasis. Patient will not require surgery at this time. He also had consultation with radiation oncology today. Will proceed with concurrent chemotherapy and XRT using Xeloda 825 mg/m twice a day Monday through Friday on each day that XRT is given. This will amount to approximately 28 treatment days. Patient will also see received IV mitomycin 10 mg/m on days 1 and 29. Return to clinic in approximately 2 weeks to initiate day 1 of treatment.

## 2015-10-21 NOTE — Progress Notes (Signed)
Patient started his XRT today.

## 2015-10-22 ENCOUNTER — Ambulatory Visit
Admission: RE | Admit: 2015-10-22 | Discharge: 2015-10-22 | Disposition: A | Discharge status: Home / Self Care | Payer: Medicare Other | Source: Ambulatory Visit | Attending: Radiation Oncology | Admitting: Radiation Oncology

## 2015-10-22 DIAGNOSIS — Z510 Encounter for antineoplastic radiation therapy: Secondary | ICD-10-CM | POA: Diagnosis not present

## 2015-10-25 ENCOUNTER — Ambulatory Visit
Admission: RE | Admit: 2015-10-25 | Discharge: 2015-10-25 | Disposition: A | Discharge status: Home / Self Care | Payer: Medicare Other | Source: Ambulatory Visit | Attending: Radiation Oncology | Admitting: Radiation Oncology

## 2015-10-25 DIAGNOSIS — Z510 Encounter for antineoplastic radiation therapy: Secondary | ICD-10-CM | POA: Diagnosis not present

## 2015-10-26 ENCOUNTER — Ambulatory Visit
Admission: RE | Admit: 2015-10-26 | Discharge: 2015-10-26 | Disposition: A | Discharge status: Home / Self Care | Payer: Medicare Other | Source: Ambulatory Visit | Attending: Radiation Oncology | Admitting: Radiation Oncology

## 2015-10-26 DIAGNOSIS — Z510 Encounter for antineoplastic radiation therapy: Secondary | ICD-10-CM | POA: Diagnosis not present

## 2015-10-27 ENCOUNTER — Ambulatory Visit
Admission: RE | Admit: 2015-10-27 | Discharge: 2015-10-27 | Disposition: A | Discharge status: Home / Self Care | Payer: Medicare Other | Source: Ambulatory Visit | Attending: Radiation Oncology | Admitting: Radiation Oncology

## 2015-10-27 DIAGNOSIS — Z510 Encounter for antineoplastic radiation therapy: Secondary | ICD-10-CM | POA: Diagnosis not present

## 2015-10-28 ENCOUNTER — Ambulatory Visit
Admission: RE | Admit: 2015-10-28 | Discharge: 2015-10-28 | Disposition: A | Discharge status: Home / Self Care | Payer: Medicare Other | Source: Ambulatory Visit | Attending: Radiation Oncology | Admitting: Radiation Oncology

## 2015-10-28 ENCOUNTER — Inpatient Hospital Stay: Payer: Medicare Other

## 2015-10-28 DIAGNOSIS — C210 Malignant neoplasm of anus, unspecified: Secondary | ICD-10-CM

## 2015-10-28 DIAGNOSIS — Z510 Encounter for antineoplastic radiation therapy: Secondary | ICD-10-CM | POA: Diagnosis not present

## 2015-10-28 LAB — CBC WITH DIFFERENTIAL/PLATELET
BASOS ABS: 0 10*3/uL (ref 0–0.1)
Basophils Relative: 1 %
EOS PCT: 2 %
Eosinophils Absolute: 0.1 10*3/uL (ref 0–0.7)
HEMATOCRIT: 45.4 % (ref 40.0–52.0)
Hemoglobin: 16 g/dL (ref 13.0–18.0)
LYMPHS ABS: 0.5 10*3/uL — AB (ref 1.0–3.6)
LYMPHS PCT: 15 %
MCH: 30.6 pg (ref 26.0–34.0)
MCHC: 35.2 g/dL (ref 32.0–36.0)
MCV: 86.9 fL (ref 80.0–100.0)
MONO ABS: 0.1 10*3/uL — AB (ref 0.2–1.0)
Monocytes Relative: 5 %
NEUTROS ABS: 2.5 10*3/uL (ref 1.4–6.5)
Neutrophils Relative %: 77 %
PLATELETS: 118 10*3/uL — AB (ref 150–440)
RBC: 5.22 MIL/uL (ref 4.40–5.90)
RDW: 12.6 % (ref 11.5–14.5)
WBC: 3.2 10*3/uL — ABNORMAL LOW (ref 3.8–10.6)

## 2015-10-28 LAB — COMPREHENSIVE METABOLIC PANEL
ALT: 16 U/L — AB (ref 17–63)
AST: 21 U/L (ref 15–41)
Albumin: 4.3 g/dL (ref 3.5–5.0)
Alkaline Phosphatase: 79 U/L (ref 38–126)
Anion gap: 6 (ref 5–15)
BILIRUBIN TOTAL: 1 mg/dL (ref 0.3–1.2)
BUN: 18 mg/dL (ref 6–20)
CHLORIDE: 102 mmol/L (ref 101–111)
CO2: 29 mmol/L (ref 22–32)
CREATININE: 1.3 mg/dL — AB (ref 0.61–1.24)
Calcium: 9.2 mg/dL (ref 8.9–10.3)
GFR, EST NON AFRICAN AMERICAN: 55 mL/min — AB (ref >60)
Glucose, Bld: 134 mg/dL — ABNORMAL HIGH (ref 65–99)
POTASSIUM: 4.2 mmol/L (ref 3.5–5.1)
Sodium: 137 mmol/L (ref 135–145)
TOTAL PROTEIN: 7.1 g/dL (ref 6.5–8.1)

## 2015-10-29 ENCOUNTER — Ambulatory Visit
Admission: RE | Admit: 2015-10-29 | Discharge: 2015-10-29 | Disposition: A | Discharge status: Home / Self Care | Payer: Medicare Other | Source: Ambulatory Visit | Attending: Radiation Oncology | Admitting: Radiation Oncology

## 2015-10-29 DIAGNOSIS — Z510 Encounter for antineoplastic radiation therapy: Secondary | ICD-10-CM | POA: Diagnosis not present

## 2015-11-01 ENCOUNTER — Ambulatory Visit
Admission: RE | Admit: 2015-11-01 | Discharge: 2015-11-01 | Disposition: A | Discharge status: Home / Self Care | Payer: Medicare Other | Source: Ambulatory Visit | Attending: Radiation Oncology | Admitting: Radiation Oncology

## 2015-11-01 DIAGNOSIS — Z510 Encounter for antineoplastic radiation therapy: Secondary | ICD-10-CM | POA: Diagnosis not present

## 2015-11-02 ENCOUNTER — Ambulatory Visit
Admission: RE | Admit: 2015-11-02 | Discharge: 2015-11-02 | Disposition: A | Discharge status: Home / Self Care | Payer: Medicare Other | Source: Ambulatory Visit | Attending: Radiation Oncology | Admitting: Radiation Oncology

## 2015-11-02 ENCOUNTER — Other Ambulatory Visit: Payer: Self-pay | Admitting: *Deleted

## 2015-11-02 DIAGNOSIS — Z510 Encounter for antineoplastic radiation therapy: Secondary | ICD-10-CM | POA: Diagnosis not present

## 2015-11-02 DIAGNOSIS — C210 Malignant neoplasm of anus, unspecified: Secondary | ICD-10-CM

## 2015-11-02 MED ORDER — HYDROCORTISONE ACETATE 25 MG RE SUPP: 25.0000 mg | Freq: Every day | RECTAL | Status: DC

## 2015-11-03 ENCOUNTER — Ambulatory Visit
Admission: RE | Admit: 2015-11-03 | Discharge: 2015-11-03 | Disposition: A | Discharge status: Home / Self Care | Payer: Medicare Other | Source: Ambulatory Visit | Attending: Radiation Oncology | Admitting: Radiation Oncology

## 2015-11-03 DIAGNOSIS — Z510 Encounter for antineoplastic radiation therapy: Secondary | ICD-10-CM | POA: Diagnosis not present

## 2015-11-04 ENCOUNTER — Inpatient Hospital Stay: Payer: Medicare Other | Attending: Oncology

## 2015-11-04 ENCOUNTER — Ambulatory Visit
Admission: RE | Admit: 2015-11-04 | Discharge: 2015-11-04 | Disposition: A | Discharge status: Home / Self Care | Payer: Medicare Other | Source: Ambulatory Visit | Attending: Radiation Oncology | Admitting: Radiation Oncology

## 2015-11-04 ENCOUNTER — Inpatient Hospital Stay: Payer: Medicare Other

## 2015-11-04 ENCOUNTER — Inpatient Hospital Stay (HOSPITAL_BASED_OUTPATIENT_CLINIC_OR_DEPARTMENT_OTHER): Payer: Medicare Other | Admitting: Oncology

## 2015-11-04 VITALS — BP 151/95 | HR 54 | Temp 98.1°F | Resp 16 | Wt 160.1 lb

## 2015-11-04 DIAGNOSIS — Z79899 Other long term (current) drug therapy: Secondary | ICD-10-CM

## 2015-11-04 DIAGNOSIS — Z923 Personal history of irradiation: Secondary | ICD-10-CM | POA: Insufficient documentation

## 2015-11-04 DIAGNOSIS — E785 Hyperlipidemia, unspecified: Secondary | ICD-10-CM | POA: Diagnosis not present

## 2015-11-04 DIAGNOSIS — E7800 Pure hypercholesterolemia, unspecified: Secondary | ICD-10-CM | POA: Insufficient documentation

## 2015-11-04 DIAGNOSIS — C210 Malignant neoplasm of anus, unspecified: Secondary | ICD-10-CM | POA: Insufficient documentation

## 2015-11-04 DIAGNOSIS — D696 Thrombocytopenia, unspecified: Secondary | ICD-10-CM | POA: Insufficient documentation

## 2015-11-04 DIAGNOSIS — R21 Rash and other nonspecific skin eruption: Secondary | ICD-10-CM

## 2015-11-04 DIAGNOSIS — Z510 Encounter for antineoplastic radiation therapy: Secondary | ICD-10-CM | POA: Diagnosis not present

## 2015-11-04 DIAGNOSIS — D72819 Decreased white blood cell count, unspecified: Secondary | ICD-10-CM | POA: Diagnosis not present

## 2015-11-04 DIAGNOSIS — F419 Anxiety disorder, unspecified: Secondary | ICD-10-CM | POA: Diagnosis not present

## 2015-11-04 DIAGNOSIS — R413 Other amnesia: Secondary | ICD-10-CM | POA: Insufficient documentation

## 2015-11-04 DIAGNOSIS — I10 Essential (primary) hypertension: Secondary | ICD-10-CM | POA: Diagnosis not present

## 2015-11-04 DIAGNOSIS — F418 Other specified anxiety disorders: Secondary | ICD-10-CM

## 2015-11-04 DIAGNOSIS — R197 Diarrhea, unspecified: Secondary | ICD-10-CM | POA: Insufficient documentation

## 2015-11-04 LAB — COMPREHENSIVE METABOLIC PANEL
ALBUMIN: 4.1 g/dL (ref 3.5–5.0)
ALT: 16 U/L — ABNORMAL LOW (ref 17–63)
ANION GAP: 3 — AB (ref 5–15)
AST: 21 U/L (ref 15–41)
Alkaline Phosphatase: 67 U/L (ref 38–126)
BILIRUBIN TOTAL: 0.8 mg/dL (ref 0.3–1.2)
BUN: 17 mg/dL (ref 6–20)
CHLORIDE: 105 mmol/L (ref 101–111)
CO2: 27 mmol/L (ref 22–32)
Calcium: 8.7 mg/dL — ABNORMAL LOW (ref 8.9–10.3)
Creatinine, Ser: 1.21 mg/dL (ref 0.61–1.24)
GFR calc Af Amer: >60 mL/min (ref >60)
GLUCOSE: 106 mg/dL — AB (ref 65–99)
POTASSIUM: 3.6 mmol/L (ref 3.5–5.1)
Sodium: 135 mmol/L (ref 135–145)
TOTAL PROTEIN: 6.4 g/dL — AB (ref 6.5–8.1)

## 2015-11-04 LAB — CBC WITH DIFFERENTIAL/PLATELET
BASOS ABS: 0 10*3/uL (ref 0–0.1)
BASOS PCT: 1 %
EOS ABS: 0.1 10*3/uL (ref 0–0.7)
EOS PCT: 4 %
HCT: 41.5 % (ref 40.0–52.0)
Hemoglobin: 14.6 g/dL (ref 13.0–18.0)
Lymphocytes Relative: 16 %
Lymphs Abs: 0.4 10*3/uL — ABNORMAL LOW (ref 1.0–3.6)
MCH: 30.8 pg (ref 26.0–34.0)
MCHC: 35.3 g/dL (ref 32.0–36.0)
MCV: 87.4 fL (ref 80.0–100.0)
MONO ABS: 0.3 10*3/uL (ref 0.2–1.0)
MONOS PCT: 13 %
Neutro Abs: 1.7 10*3/uL (ref 1.4–6.5)
Neutrophils Relative %: 66 %
PLATELETS: 66 10*3/uL — AB (ref 150–440)
RBC: 4.74 MIL/uL (ref 4.40–5.90)
RDW: 12.5 % (ref 11.5–14.5)
WBC: 2.5 10*3/uL — ABNORMAL LOW (ref 3.8–10.6)

## 2015-11-04 NOTE — Progress Notes (Signed)
Patient is starting to notice a rash that is worse on his face.  Had a nose bleed during the night last night.

## 2015-11-05 ENCOUNTER — Ambulatory Visit
Admission: RE | Admit: 2015-11-05 | Discharge: 2015-11-05 | Disposition: A | Discharge status: Home / Self Care | Payer: Medicare Other | Source: Ambulatory Visit | Attending: Radiation Oncology | Admitting: Radiation Oncology

## 2015-11-05 DIAGNOSIS — Z510 Encounter for antineoplastic radiation therapy: Secondary | ICD-10-CM | POA: Diagnosis not present

## 2015-11-05 NOTE — Progress Notes (Signed)
[facility]  Telephone:(336) [phone] Fax:(336) [phone]  ID: LYNETTE ADU OB: 10/15/1945  MR#: [id number]  [id number]  Patient Care Team: Maryland Pink, MD as PCP - General (Family Medicine)  CHIEF COMPLAINT:  Chief Complaint  Patient presents with  . anal cancer    INTERVAL HISTORY: Patient returns to clinic today to For repeat laboratory work and further evaluation. He is tolerating his XRT, Xeloda, and mitomycin well with only minimal side effects. He has a mild rash on his face, but does not bother her more affect his day-to-day activity. He had a small nosebleed overnight as well. He otherwise feels well and is asymptomatic. He has no neurologic complaints.  He denies any recent fevers or illnesses. He has a good appetite and denies weight loss. He denies any chest pain or shortness of breath. He has no nausea, vomiting, or diarrhea. He has noted no melena or hematochezia. He has no urinary complaints. Patient offers no specific complaints today.  REVIEW OF  SYSTEMS:   Review of Systems  Constitutional: Negative.  Negative for fever and weight loss.  HENT: Positive for nosebleeds.   Respiratory: Negative.  Negative for cough and shortness of breath.   Cardiovascular: Negative.  Negative for chest pain.  Gastrointestinal: Negative for abdominal pain, constipation, blood in stool and melena.  Musculoskeletal: Negative.   Skin: Positive for rash.  Neurological: Negative.  Negative for weakness.    As per HPI. Otherwise, a complete review of systems is negatve.  PAST MEDICAL HISTORY: Past Medical History  Diagnosis Date  . Headache(784.0)   . HTN (hypertension)   . Hyperlipidemia   . High cholesterol   . Anxiety and depression   . Cancer ([place])   . Memory loss     PAST SURGICAL HISTORY: Past Surgical History  Procedure Laterality Date  . Colonoscopy with propofol N/A 09/13/2015    Procedure: COLONOSCOPY WITH PROPOFOL;  Surgeon: Hulen Luster, MD;   Location: [facility] ENDOSCOPY;  Service: Gastroenterology;  Laterality: N/A;  . Eus N/A 09/30/2015    Procedure: LOWER ENDOSCOPIC ULTRASOUND (EUS);  Surgeon: Holly Bodily, MD;  Location: [facility] ENDOSCOPY;  Service: Gastroenterology;  Laterality: N/A;    FAMILY HISTORY Family History  Problem Relation Age of Onset  . Cancer    . Cancer Other        ADVANCED DIRECTIVES:    HEALTH MAINTENANCE: Social History  Substance Use Topics  . Smoking status: Never Smoker   . Smokeless tobacco: Never Used  . Alcohol Use: No     Colonoscopy:  PAP:  Bone density:  Lipid panel:  Allergies  Allergen Reactions  . Sertraline Other (See Comments)    Makes skin crawl  . Topiramate Other (See Comments)    Mania    Current Outpatient Prescriptions  Medication Sig Dispense Refill  . bisoprolol-hydrochlorothiazide (ZIAC) 2.5-6.25 MG per tablet Take 1 tablet by mouth 2 (two) times daily.     . capecitabine (XELODA) 500 MG tablet Take 3 tablets (1,500 mg total) by mouth 2 (two) times daily after a meal. 120 tablet 0  . escitalopram (LEXAPRO) 10 MG tablet Take 10 mg by mouth daily.  1  . fluticasone (FLONASE) 50 MCG/ACT nasal spray Place 2 sprays into both nostrils daily as needed for rhinitis.     . hydrocortisone (ANUSOL-HC) 25 MG suppository Place 1 suppository (25 mg total) rectally daily. 20 suppository 1  . metoCLOPramide (REGLAN) 10 MG tablet Take 10 mg by mouth every 6 (six)  hours as needed for nausea.    Marland Kitchen omeprazole (PRILOSEC) 40 MG capsule Take 40 mg by mouth every evening.     . potassium chloride (K-DUR,KLOR-CON) 10 MEQ tablet Take 10 mEq by mouth daily.    . prochlorperazine (COMPAZINE) 10 MG tablet Take 1 tablet (10 mg total) by mouth every 6 (six) hours as needed (Nausea or vomiting). 30 tablet 1  . sodium bicarbonate 650 MG tablet Take 1,300 mg by mouth every evening.     . zonisamide (ZONEGRAN) 25 MG capsule Take 75 mg by mouth at bedtime.      No current facility-administered  medications for this visit.    OBJECTIVE: Filed Vitals:   11/04/15 1053  BP: 151/95  Pulse: 54  Temp: 98.1 F (36.7 C)  Resp: 16     Body mass index is 23.3 kg/(m^2).    ECOG FS:0 - Asymptomatic  General: Well-developed, well-nourished, no acute distress. Eyes: Pink conjunctiva, anicteric sclera. Lungs: Clear to auscultation bilaterally. Heart: Regular rate and rhythm. No rubs, murmurs, or gallops. Abdomen: Soft, nontender, nondistended. No organomegaly noted, normoactive bowel sounds. Musculoskeletal: No edema, cyanosis, or clubbing. Neuro: Alert, answering all questions appropriately. Cranial nerves grossly intact. Skin: No rashes or petechiae noted. Psych: Normal affect.   LAB RESULTS:  Lab Results  Component Value Date   NA 135 11/04/2015   K 3.6 11/04/2015   CL 105 11/04/2015   CO2 27 11/04/2015   GLUCOSE 106* 11/04/2015   BUN 17 11/04/2015   CREATININE 1.21 11/04/2015   CALCIUM 8.7* 11/04/2015   PROT 6.4* 11/04/2015   ALBUMIN 4.1 11/04/2015   AST 21 11/04/2015   ALT 16* 11/04/2015   ALKPHOS 67 11/04/2015   BILITOT 0.8 11/04/2015   GFRNONAA >60 11/04/2015   GFRAA >60 11/04/2015    Lab Results  Component Value Date   WBC 2.5* 11/04/2015   NEUTROABS 1.7 11/04/2015   HGB 14.6 11/04/2015   HCT 41.5 11/04/2015   MCV 87.4 11/04/2015   PLT 66* 11/04/2015     STUDIES: Nm Pet Image Initial (pi) Skull Base To Thigh  10/08/2015  CLINICAL DATA:  Initial treatment strategy for anal carcinoma. EXAM: NUCLEAR MEDICINE PET SKULL BASE TO THIGH TECHNIQUE: 13.0 mCi F-18 FDG was injected intravenously. Full-ring PET imaging was performed from the skull base to thigh after the radiotracer. CT data was obtained and used for attenuation correction and anatomic localization. FASTING BLOOD GLUCOSE:  Value: 89 mg/dl COMPARISON:  CT on 09/27/2015 FINDINGS: NECK No hypermetabolic lymph nodes in the neck. CHEST No hypermetabolic mediastinal or hilar nodes. No suspicious pulmonary  nodules on the CT scan. ABDOMEN/PELVIS No abnormal hypermetabolic activity within the liver, pancreas, adrenal glands, or spleen. Small benign left hepatic lobe cyst is seen in the lateral segment adjacent gallbladder fossa which shows no hypermetabolic activity. No hypermetabolic lymph nodes in the abdomen or pelvis. A hypermetabolic mass is seen within the anus. This has an SUV max of 14.1, consistent with known anal carcinoma. There is also some hypermetabolic activity seen corresponding to nodular wall thickening involving the lower right lateral rectal wall with SUV max of 4.3, suspicious for local extension through muscularis propria. In addition, there is a 6 mm left posterior perirectal lymph node at approximately the 4 o'clock position Image 223 of series 3, which has SUV max of 2.5. SKELETON No focal hypermetabolic activity to suggest skeletal metastasis. IMPRESSION: Hypermetabolic mass within the anus, consistent with known anal carcinoma. Nodular hypermetabolic wall thickening along the right lower  lateral rectal wall is suspicious for local extension through muscularis propria. Hypermetabolic 6 mm left posterior perirectal lymph node is consistent with local lymph node metastasis. No evidence of distant metastatic disease. Electronically Signed   By: Earle Gell M.D.   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 Anal cancer: EUS, CT, and PET scan results reviewed independently confirming local disease without distant metastasis. Patient will not require surgery at this time. Continue daily XRT and oral Xeloda 825 mg/m twice a day Monday through Friday on each day that XRT is given. This will amount to approximately 28 treatment days. Patient will receive IV mitomycin 10 mg/m on days 1 and 29. Return to clinic in approximately 2 weeks for laboratory work, further evaluation, and his next infusion of mitomycin.  2. Constipation: Resolved. 3.  Elevated blood pressure: Monitor. 4. Rash: Mild, likely secondary to Xeloda. Monitor. 5. Leukopenia: Secondary to chemotherapy, monitor. 6. Thrombocytopenia: Also secondary chemotherapy. Will recheck laboratory work in 1 week.    Patient expressed understanding and was in agreement with this plan. He also understands that He can call clinic at any time with any questions, concerns, or complaints.    Lloyd Huger, MD   11/05/2015 12:36 AM

## 2015-11-05 NOTE — Progress Notes (Signed)
[place]  [phone]) [phone] Fax:(336) [phone]  ID: Brad Randall OB: 24-Jan-1946  MR#: [id number]  [id number]  Patient Care Team: Maryland Pink, MD as PCP - General (Family Medicine)  CHIEF COMPLAINT:  Chief Complaint  Patient presents with  . anal cancer    INTERVAL HISTORY: Patient returns to clinic today to initiate treatment with XRT, Xeloda, and mitomycin. He currently feels well and is asymptomatic. He has no neurologic complaints.  He denies any recent fevers or illnesses. He has a good appetite and denies weight loss. He denies any chest pain or shortness of breath. He has no nausea, vomiting, or diarrhea. He has noted no melena or hematochezia. He has no urinary complaints. Patient offers no specific complaints today.  REVIEW OF  SYSTEMS:   Review of Systems  Constitutional: Negative.  Negative for fever and weight loss.  Respiratory: Negative.  Negative for cough and shortness of breath.   Cardiovascular: Negative.  Negative for chest pain.  Gastrointestinal: Negative for abdominal pain, constipation, blood in stool and melena.  Musculoskeletal: Negative.   Neurological: Negative.  Negative for weakness.    As per HPI. Otherwise, a complete review of systems is negatve.  PAST MEDICAL HISTORY: Past Medical History  Diagnosis Date  . Headache(784.0)   . HTN (hypertension)   . Hyperlipidemia   . High cholesterol   . Anxiety and depression   . Cancer ([place])   . Memory loss     PAST SURGICAL HISTORY: Past Surgical History  Procedure Laterality Date  . Colonoscopy with propofol N/A 09/13/2015    Procedure: COLONOSCOPY WITH PROPOFOL;  Surgeon: Hulen Luster, MD;  Location: [facility] ENDOSCOPY;  Service: Gastroenterology;  Laterality: N/A;  . Eus N/A 09/30/2015    Procedure: LOWER ENDOSCOPIC ULTRASOUND (EUS);  Surgeon: Holly Bodily, MD;  Location: [facility] ENDOSCOPY;  Service: Gastroenterology;  Laterality: N/A;    FAMILY HISTORY Family  History  Problem Relation Age of Onset  . Cancer    . Cancer Other        ADVANCED DIRECTIVES:    HEALTH MAINTENANCE: Social History  Substance Use Topics  . Smoking status: Never Smoker   . Smokeless tobacco: Never Used  . Alcohol Use: No     Colonoscopy:  PAP:  Bone density:  Lipid panel:  Allergies  Allergen Reactions  . Sertraline Other (See Comments)    Makes skin crawl  . Topiramate Other (See Comments)    Mania    Current Outpatient Prescriptions  Medication Sig Dispense Refill  . bisoprolol-hydrochlorothiazide (ZIAC) 2.5-6.25 MG per tablet Take 1 tablet by mouth 2 (two) times daily.     Marland Kitchen escitalopram (LEXAPRO) 10 MG tablet Take 10 mg by mouth daily.  1  . fluticasone (FLONASE) 50 MCG/ACT nasal spray Place 2 sprays into both nostrils daily as needed for rhinitis.     Marland Kitchen omeprazole (PRILOSEC) 40 MG capsule Take 40 mg by mouth every evening.     . potassium chloride (K-DUR,KLOR-CON) 10 MEQ tablet Take 10 mEq by mouth daily.    . prochlorperazine (COMPAZINE) 10 MG tablet Take 1 tablet (10 mg total) by mouth every 6 (six) hours as needed (Nausea or vomiting). 30 tablet 1  . sodium bicarbonate 650 MG tablet Take 1,300 mg by mouth every evening.     . zonisamide (ZONEGRAN) 25 MG capsule Take 75 mg by mouth at bedtime.     . capecitabine (XELODA) 500 MG tablet Take 3 tablets (1,500 mg total) by mouth  2 (two) times daily after a meal. 120 tablet 0  . hydrocortisone (ANUSOL-HC) 25 MG suppository Place 1 suppository (25 mg total) rectally daily. 20 suppository 1  . metoCLOPramide (REGLAN) 10 MG tablet Take 10 mg by mouth every 6 (six) hours as needed for nausea.     No current facility-administered medications for this visit.    OBJECTIVE: Filed Vitals:   10/21/15 1124  BP: 160/83  Pulse: 53  Temp: 98.3 F (36.8 C)  Resp: 16     Body mass index is 22.89 kg/(m^2).    ECOG FS:0 - Asymptomatic  General: Well-developed, well-nourished, no acute distress. Eyes: Pink  conjunctiva, anicteric sclera. Lungs: Clear to auscultation bilaterally. Heart: Regular rate and rhythm. No rubs, murmurs, or gallops. Abdomen: Soft, nontender, nondistended. No organomegaly noted, normoactive bowel sounds. Musculoskeletal: No edema, cyanosis, or clubbing. Neuro: Alert, answering all questions appropriately. Cranial nerves grossly intact. Skin: No rashes or petechiae noted. Psych: Normal affect.   LAB RESULTS:  Lab Results  Component Value Date   NA 135 11/04/2015   K 3.6 11/04/2015   CL 105 11/04/2015   CO2 27 11/04/2015   GLUCOSE 106* 11/04/2015   BUN 17 11/04/2015   CREATININE 1.21 11/04/2015   CALCIUM 8.7* 11/04/2015   PROT 6.4* 11/04/2015   ALBUMIN 4.1 11/04/2015   AST 21 11/04/2015   ALT 16* 11/04/2015   ALKPHOS 67 11/04/2015   BILITOT 0.8 11/04/2015   GFRNONAA >60 11/04/2015   GFRAA >60 11/04/2015    Lab Results  Component Value Date   WBC 2.5* 11/04/2015   NEUTROABS 1.7 11/04/2015   HGB 14.6 11/04/2015   HCT 41.5 11/04/2015   MCV 87.4 11/04/2015   PLT 66* 11/04/2015     STUDIES: Nm Pet Image Initial (pi) Skull Base To Thigh  10/08/2015  CLINICAL DATA:  Initial treatment strategy for anal carcinoma. EXAM: NUCLEAR MEDICINE PET SKULL BASE TO THIGH TECHNIQUE: 13.0 mCi F-18 FDG was injected intravenously. Full-ring PET imaging was performed from the skull base to thigh after the radiotracer. CT data was obtained and used for attenuation correction and anatomic localization. FASTING BLOOD GLUCOSE:  Value: 89 mg/dl COMPARISON:  CT on 09/27/2015 FINDINGS: NECK No hypermetabolic lymph nodes in the neck. CHEST No hypermetabolic mediastinal or hilar nodes. No suspicious pulmonary nodules on the CT scan. ABDOMEN/PELVIS No abnormal hypermetabolic activity within the liver, pancreas, adrenal glands, or spleen. Small benign left hepatic lobe cyst is seen in the lateral segment adjacent gallbladder fossa which shows no hypermetabolic activity. No hypermetabolic  lymph nodes in the abdomen or pelvis. A hypermetabolic mass is seen within the anus. This has an SUV max of 14.1, consistent with known anal carcinoma. There is also some hypermetabolic activity seen corresponding to nodular wall thickening involving the lower right lateral rectal wall with SUV max of 4.3, suspicious for local extension through muscularis propria. In addition, there is a 6 mm left posterior perirectal lymph node at approximately the 4 o'clock position Image 223 of series 3, which has SUV max of 2.5. SKELETON No focal hypermetabolic activity to suggest skeletal metastasis. IMPRESSION: Hypermetabolic mass within the anus, consistent with known anal carcinoma. Nodular hypermetabolic wall thickening along the right lower lateral rectal wall is suspicious for local extension through muscularis propria. Hypermetabolic 6 mm left posterior perirectal lymph node is consistent with local lymph node metastasis. No evidence of distant metastatic disease. Electronically Signed   By: Earle Gell M.D.   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 Anal cancer: EUS, CT, and PET scan results reviewed independently confirming local disease without distant metastasis. Patient will not require surgery at this time. Patient initiated his XRT today. He will also receive 10 mg/m mitomycin today. Finally, patient has been instructed to initiate his oral Xeloda 825 mg/m twice a day Monday through Friday on each day that XRT is given. This will amount to approximately 28 treatment days. Patient will receive IV mitomycin 10 mg/m on days 1 and 29. Return to clinic in approximately 2 weeks for laboratory work and further evaluation and then in 4 weeks for consideration of his next infusion of mitomycin. 2. Constipation: Resolved. 3. Elevated blood pressure: Monitor.    Patient expressed understanding and was in agreement with this plan. He also understands that He  can call clinic at any time with any questions, concerns, or complaints.    Lloyd Huger, MD   11/05/2015 12:31 AM

## 2015-11-08 ENCOUNTER — Ambulatory Visit
Admission: RE | Admit: 2015-11-08 | Discharge: 2015-11-08 | Disposition: A | Discharge status: Home / Self Care | Payer: Medicare Other | Source: Ambulatory Visit | Attending: Radiation Oncology | Admitting: Radiation Oncology

## 2015-11-08 DIAGNOSIS — Z510 Encounter for antineoplastic radiation therapy: Secondary | ICD-10-CM | POA: Diagnosis not present

## 2015-11-09 ENCOUNTER — Ambulatory Visit
Admission: RE | Admit: 2015-11-09 | Discharge: 2015-11-09 | Disposition: A | Discharge status: Home / Self Care | Payer: Medicare Other | Source: Ambulatory Visit | Attending: Radiation Oncology | Admitting: Radiation Oncology

## 2015-11-09 DIAGNOSIS — Z510 Encounter for antineoplastic radiation therapy: Secondary | ICD-10-CM | POA: Diagnosis not present

## 2015-11-10 ENCOUNTER — Ambulatory Visit
Admission: RE | Admit: 2015-11-10 | Discharge: 2015-11-10 | Disposition: A | Discharge status: Home / Self Care | Payer: Medicare Other | Source: Ambulatory Visit | Attending: Radiation Oncology | Admitting: Radiation Oncology

## 2015-11-10 DIAGNOSIS — Z510 Encounter for antineoplastic radiation therapy: Secondary | ICD-10-CM | POA: Diagnosis not present

## 2015-11-11 ENCOUNTER — Inpatient Hospital Stay: Payer: Medicare Other

## 2015-11-11 ENCOUNTER — Ambulatory Visit
Admission: RE | Admit: 2015-11-11 | Discharge: 2015-11-11 | Disposition: A | Discharge status: Home / Self Care | Payer: Medicare Other | Source: Ambulatory Visit | Attending: Radiation Oncology | Admitting: Radiation Oncology

## 2015-11-11 DIAGNOSIS — Z510 Encounter for antineoplastic radiation therapy: Secondary | ICD-10-CM | POA: Diagnosis not present

## 2015-11-11 DIAGNOSIS — C210 Malignant neoplasm of anus, unspecified: Secondary | ICD-10-CM | POA: Diagnosis not present

## 2015-11-11 LAB — CBC WITH DIFFERENTIAL/PLATELET
BASOS PCT: 0 %
Basophils Absolute: 0 10*3/uL (ref 0–0.1)
Eosinophils Absolute: 0.1 10*3/uL (ref 0–0.7)
Eosinophils Relative: 5 %
HEMATOCRIT: 42.3 % (ref 40.0–52.0)
HEMOGLOBIN: 14.9 g/dL (ref 13.0–18.0)
LYMPHS ABS: 0.3 10*3/uL — AB (ref 1.0–3.6)
Lymphocytes Relative: 11 %
MCH: 30.7 pg (ref 26.0–34.0)
MCHC: 35.2 g/dL (ref 32.0–36.0)
MCV: 87.1 fL (ref 80.0–100.0)
MONO ABS: 0.3 10*3/uL (ref 0.2–1.0)
MONOS PCT: 13 %
Neutro Abs: 1.8 10*3/uL (ref 1.4–6.5)
Neutrophils Relative %: 71 %
Platelets: 81 10*3/uL — ABNORMAL LOW (ref 150–440)
RBC: 4.86 MIL/uL (ref 4.40–5.90)
RDW: 12.5 % (ref 11.5–14.5)
WBC: 2.6 10*3/uL — AB (ref 3.8–10.6)

## 2015-11-11 LAB — COMPREHENSIVE METABOLIC PANEL
ALBUMIN: 4.1 g/dL (ref 3.5–5.0)
ALK PHOS: 62 U/L (ref 38–126)
ALT: 18 U/L (ref 17–63)
ANION GAP: 5 (ref 5–15)
AST: 26 U/L (ref 15–41)
BILIRUBIN TOTAL: 1.3 mg/dL — AB (ref 0.3–1.2)
BUN: 13 mg/dL (ref 6–20)
CALCIUM: 9 mg/dL (ref 8.9–10.3)
CO2: 28 mmol/L (ref 22–32)
Chloride: 103 mmol/L (ref 101–111)
Creatinine, Ser: 1.37 mg/dL — ABNORMAL HIGH (ref 0.61–1.24)
GFR calc Af Amer: >60 mL/min (ref >60)
GFR, EST NON AFRICAN AMERICAN: 52 mL/min — AB (ref >60)
GLUCOSE: 129 mg/dL — AB (ref 65–99)
POTASSIUM: 3.6 mmol/L (ref 3.5–5.1)
Sodium: 136 mmol/L (ref 135–145)
Total Protein: 6.4 g/dL — ABNORMAL LOW (ref 6.5–8.1)

## 2015-11-12 ENCOUNTER — Ambulatory Visit
Admission: RE | Admit: 2015-11-12 | Discharge: 2015-11-12 | Disposition: A | Discharge status: Home / Self Care | Payer: Medicare Other | Source: Ambulatory Visit | Attending: Radiation Oncology | Admitting: Radiation Oncology

## 2015-11-12 DIAGNOSIS — Z510 Encounter for antineoplastic radiation therapy: Secondary | ICD-10-CM | POA: Diagnosis not present

## 2015-11-15 ENCOUNTER — Ambulatory Visit
Admission: RE | Admit: 2015-11-15 | Discharge: 2015-11-15 | Disposition: A | Discharge status: Home / Self Care | Payer: Medicare Other | Source: Ambulatory Visit | Attending: Radiation Oncology | Admitting: Radiation Oncology

## 2015-11-15 DIAGNOSIS — Z510 Encounter for antineoplastic radiation therapy: Secondary | ICD-10-CM | POA: Diagnosis not present

## 2015-11-16 ENCOUNTER — Ambulatory Visit
Admission: RE | Admit: 2015-11-16 | Discharge: 2015-11-16 | Disposition: A | Discharge status: Home / Self Care | Payer: Medicare Other | Source: Ambulatory Visit | Attending: Radiation Oncology | Admitting: Radiation Oncology

## 2015-11-16 ENCOUNTER — Other Ambulatory Visit: Payer: Self-pay | Admitting: *Deleted

## 2015-11-16 DIAGNOSIS — Z510 Encounter for antineoplastic radiation therapy: Secondary | ICD-10-CM | POA: Diagnosis not present

## 2015-11-16 MED ORDER — HYDROCORTISONE 2.5 % RE CREA: 1.0000 "application " | TOPICAL_CREAM | Freq: Two times a day (BID) | RECTAL | Status: DC

## 2015-11-17 ENCOUNTER — Ambulatory Visit
Admission: RE | Admit: 2015-11-17 | Discharge: 2015-11-17 | Disposition: A | Discharge status: Home / Self Care | Payer: Medicare Other | Source: Ambulatory Visit | Attending: Radiation Oncology | Admitting: Radiation Oncology

## 2015-11-17 DIAGNOSIS — Z510 Encounter for antineoplastic radiation therapy: Secondary | ICD-10-CM | POA: Diagnosis not present

## 2015-11-18 ENCOUNTER — Inpatient Hospital Stay (HOSPITAL_BASED_OUTPATIENT_CLINIC_OR_DEPARTMENT_OTHER): Payer: Medicare Other | Admitting: Oncology

## 2015-11-18 ENCOUNTER — Ambulatory Visit
Admission: RE | Admit: 2015-11-18 | Discharge: 2015-11-18 | Disposition: A | Discharge status: Home / Self Care | Payer: Medicare Other | Source: Ambulatory Visit | Attending: Radiation Oncology | Admitting: Radiation Oncology

## 2015-11-18 ENCOUNTER — Inpatient Hospital Stay: Payer: Medicare Other

## 2015-11-18 VITALS — BP 135/86 | HR 59 | Temp 98.7°F | Resp 18 | Wt 157.8 lb

## 2015-11-18 DIAGNOSIS — R21 Rash and other nonspecific skin eruption: Secondary | ICD-10-CM

## 2015-11-18 DIAGNOSIS — R197 Diarrhea, unspecified: Secondary | ICD-10-CM

## 2015-11-18 DIAGNOSIS — Z510 Encounter for antineoplastic radiation therapy: Secondary | ICD-10-CM | POA: Diagnosis not present

## 2015-11-18 DIAGNOSIS — C210 Malignant neoplasm of anus, unspecified: Secondary | ICD-10-CM

## 2015-11-18 DIAGNOSIS — D696 Thrombocytopenia, unspecified: Secondary | ICD-10-CM

## 2015-11-18 DIAGNOSIS — Z79899 Other long term (current) drug therapy: Secondary | ICD-10-CM

## 2015-11-18 DIAGNOSIS — D72819 Decreased white blood cell count, unspecified: Secondary | ICD-10-CM | POA: Diagnosis not present

## 2015-11-18 LAB — COMPREHENSIVE METABOLIC PANEL
ALBUMIN: 4.1 g/dL (ref 3.5–5.0)
ALK PHOS: 64 U/L (ref 38–126)
ALT: 14 U/L — ABNORMAL LOW (ref 17–63)
ANION GAP: 3 — AB (ref 5–15)
AST: 21 U/L (ref 15–41)
BILIRUBIN TOTAL: 1.5 mg/dL — AB (ref 0.3–1.2)
BUN: 13 mg/dL (ref 6–20)
CALCIUM: 8.8 mg/dL — AB (ref 8.9–10.3)
CO2: 29 mmol/L (ref 22–32)
Chloride: 104 mmol/L (ref 101–111)
Creatinine, Ser: 1.25 mg/dL — ABNORMAL HIGH (ref 0.61–1.24)
GFR, EST NON AFRICAN AMERICAN: 58 mL/min — AB (ref >60)
GLUCOSE: 103 mg/dL — AB (ref 65–99)
Potassium: 3.9 mmol/L (ref 3.5–5.1)
Sodium: 136 mmol/L (ref 135–145)
TOTAL PROTEIN: 6.3 g/dL — AB (ref 6.5–8.1)

## 2015-11-18 LAB — CBC WITH DIFFERENTIAL/PLATELET
Basophils Absolute: 0 10*3/uL (ref 0–0.1)
Basophils Relative: 0 %
Eosinophils Absolute: 0.3 10*3/uL (ref 0–0.7)
Eosinophils Relative: 7 %
HEMATOCRIT: 40.8 % (ref 40.0–52.0)
HEMOGLOBIN: 14.4 g/dL (ref 13.0–18.0)
LYMPHS ABS: 0.3 10*3/uL — AB (ref 1.0–3.6)
LYMPHS PCT: 7 %
MCH: 31 pg (ref 26.0–34.0)
MCHC: 35.3 g/dL (ref 32.0–36.0)
MCV: 87.9 fL (ref 80.0–100.0)
MONOS PCT: 13 %
Monocytes Absolute: 0.6 10*3/uL (ref 0.2–1.0)
NEUTROS ABS: 3.2 10*3/uL (ref 1.4–6.5)
NEUTROS PCT: 73 %
Platelets: 94 10*3/uL — ABNORMAL LOW (ref 150–440)
RBC: 4.64 MIL/uL (ref 4.40–5.90)
RDW: 12.3 % (ref 11.5–14.5)
WBC: 4.4 10*3/uL (ref 3.8–10.6)

## 2015-11-18 NOTE — Progress Notes (Signed)
[place]  Telephone:(336) [phone] Fax:(336) [phone]  ID: Brad Randall OB: [birth year]  MR#: [id number]  [id number]  Patient Care Team: Maryland Pink, MD as PCP - General (Family Medicine)  CHIEF COMPLAINT:  Chief Complaint  Patient presents with  . anal cancer    INTERVAL HISTORY: Patient returns to clinic today for repeat laboratory work and further evaluation. He has developed diarrhea for the past 6-7 days and has severe rectal burning and irritation. He had treatment 21/30 XRT today. He took his last Xeloda yesterday and has no more to take. His rash is improved. He otherwise feels well.  He has no neurologic complaints.  He denies any recent fevers or illnesses. He has a good appetite and denies weight loss. He denies any chest pain or shortness of breath. He has no nausea, vomiting, or constipation. He has noted no melena or hematochezia. He has no urinary complaints.   REVIEW OF  SYSTEMS:   Review of Systems  Constitutional: Negative.  Negative for fever and weight loss.  HENT: Negative.   Respiratory: Negative.  Negative for cough and shortness of breath.   Cardiovascular: Negative.  Negative for chest pain.  Gastrointestinal: Negative for abdominal pain, constipation, blood in stool and melena.  Musculoskeletal: Negative.   Skin: Positive for rash.  Neurological: Negative.  Negative for weakness.    As per HPI. Otherwise, a complete review of systems is negatve.  PAST MEDICAL HISTORY: Past Medical History  Diagnosis Date  . Headache(784.0)   . HTN (hypertension)   . Hyperlipidemia   . High cholesterol   . Anxiety and depression   . Cancer ([place])   . Memory loss     PAST SURGICAL HISTORY: Past Surgical History  Procedure Laterality Date  . Colonoscopy with propofol N/A 09/13/2015    Procedure: COLONOSCOPY WITH PROPOFOL;  Surgeon: Hulen Luster, MD;  Location: [facility] ENDOSCOPY;  Service: Gastroenterology;  Laterality: N/A;  . Eus N/A  09/30/2015    Procedure: LOWER ENDOSCOPIC ULTRASOUND (EUS);  Surgeon: Holly Bodily, MD;  Location: [facility] ENDOSCOPY;  Service: Gastroenterology;  Laterality: N/A;    FAMILY HISTORY Family History  Problem Relation Age of Onset  . Cancer    . Cancer Other        ADVANCED DIRECTIVES:    HEALTH MAINTENANCE: Social History  Substance Use Topics  . Smoking status: Never Smoker   . Smokeless tobacco: Never Used  . Alcohol Use: No    Allergies  Allergen Reactions  . Sertraline Other (See Comments)    Makes skin crawl  . Topiramate Other (See Comments)    Mania    Current Outpatient Prescriptions  Medication Sig Dispense Refill  . bisoprolol-hydrochlorothiazide (ZIAC) 2.5-6.25 MG per tablet Take 1 tablet by mouth 2 (two) times daily.     . capecitabine (XELODA) 500 MG tablet Take 3 tablets (1,500 mg total) by mouth 2 (two) times daily after a meal. 120 tablet 0  . escitalopram (LEXAPRO) 10 MG tablet Take 10 mg by mouth daily.  1  . fluticasone (FLONASE) 50 MCG/ACT nasal spray Place 2 sprays into both nostrils daily as needed for rhinitis.     . hydrocortisone (ANUSOL-HC) 2.5 % rectal cream Place 1 application rectally 2 (two) times daily. 30 g 3  . hydrocortisone (ANUSOL-HC) 25 MG suppository Place 1 suppository (25 mg total) rectally daily. 20 suppository 1  . KLOR-CON 10 10 MEQ tablet Take 10 mEq by mouth daily.  5  . metoCLOPramide (REGLAN)  10 MG tablet Take 10 mg by mouth every 6 (six) hours as needed for nausea.    Marland Kitchen omeprazole (PRILOSEC) 40 MG capsule Take 40 mg by mouth every evening.     . potassium chloride (K-DUR,KLOR-CON) 10 MEQ tablet Take 10 mEq by mouth daily.    . prochlorperazine (COMPAZINE) 10 MG tablet Take 1 tablet (10 mg total) by mouth every 6 (six) hours as needed (Nausea or vomiting). 30 tablet 1  . sodium bicarbonate 650 MG tablet Take 1,300 mg by mouth every evening.     . zonisamide (ZONEGRAN) 25 MG capsule Take 75 mg by mouth at bedtime.      No  current facility-administered medications for this visit.    OBJECTIVE: Filed Vitals:   11/18/15 1107  BP: 135/86  Pulse: 59  Temp: 98.7 F (37.1 C)  Resp: 18     Body mass index is 22.98 kg/(m^2).    ECOG FS:0 - Asymptomatic  General: Well-developed, well-nourished, no acute distress. Eyes: Pink conjunctiva, anicteric sclera. Lungs: Clear to auscultation bilaterally. Heart: Regular rate and rhythm. No rubs, murmurs, or gallops. Abdomen: Soft, nontender, nondistended. No organomegaly noted, normoactive bowel sounds. Musculoskeletal: No edema, cyanosis, or clubbing. Neuro: Alert, answering all questions appropriately. Cranial nerves grossly intact. Skin: No rashes or petechiae noted. Psych: Normal affect.   LAB RESULTS:  Lab Results  Component Value Date   NA 136 11/18/2015   K 3.9 11/18/2015   CL 104 11/18/2015   CO2 29 11/18/2015   GLUCOSE 103* 11/18/2015   BUN 13 11/18/2015   CREATININE 1.25* 11/18/2015   CALCIUM 8.8* 11/18/2015   PROT 6.3* 11/18/2015   ALBUMIN 4.1 11/18/2015   AST 21 11/18/2015   ALT 14* 11/18/2015   ALKPHOS 64 11/18/2015   BILITOT 1.5* 11/18/2015   GFRNONAA 58* 11/18/2015   GFRAA >60 11/18/2015    Lab Results  Component Value Date   WBC 4.4 11/18/2015   NEUTROABS 3.2 11/18/2015   HGB 14.4 11/18/2015   HCT 40.8 11/18/2015   MCV 87.9 11/18/2015   PLT 94* 11/18/2015     STUDIES: No results found.  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 Anal cancer: EUS, CT, and PET scan results confirmed local disease without distant metastasis. Patient will not require surgery at this time. Continue daily XRT and oral Xeloda 825 mg/m twice a day Monday through Friday on each day that XRT is given. This will amount to approximately 28 treatment days. Patient will receive IV mitomycin 10 mg/m on days 1 and 29. We will hold mitomycin today due to severe diarrhea. Return to clinic in 1 week for laboratory work, further  evaluation, and his next infusion of mitomycin.  2. Diarrhea: Imodium PRN. Anusol from Dr Donella Stade. Hold chemo today. 3. Rash: Improved. Likely secondary to Xeloda. Monitor. 4. Leukopenia: WNL today at 4.4. Secondary to chemotherapy, monitor. 5. Thrombocytopenia: PLT 94 today. Secondary chemotherapy.     Patient expressed understanding and was in agreement with this plan. He also understands that He can call clinic at any time with any questions, concerns, or complaints.    Mayra Reel, NP   11/18/2015 11:35 AM  Patient was seen and evaluated independently and I agree with the assessment and plan as dictated above.  Lloyd Huger, MD 11/21/2015 7:58 PM

## 2015-11-18 NOTE — Progress Notes (Signed)
Patient has diarrhea that has flared up his hemorrhoids and has caused irration of the rectum with severe burning.  He is taking Imodium but it does not stop the diarrhea and still has frequent loose bowels.  Dr. Baruch Gouty prescribed him Anusol cream that does help relieve the burning.  The rash on his face has slightly improved.  Also has recently noticed episodes of feeling off balance when turning his head to look to the side.

## 2015-11-19 ENCOUNTER — Ambulatory Visit
Admission: RE | Admit: 2015-11-19 | Discharge: 2015-11-19 | Disposition: A | Discharge status: Home / Self Care | Payer: Medicare Other | Source: Ambulatory Visit | Attending: Radiation Oncology | Admitting: Radiation Oncology

## 2015-11-19 DIAGNOSIS — Z510 Encounter for antineoplastic radiation therapy: Secondary | ICD-10-CM | POA: Diagnosis not present

## 2015-11-22 ENCOUNTER — Other Ambulatory Visit: Payer: Self-pay | Admitting: *Deleted

## 2015-11-22 ENCOUNTER — Ambulatory Visit
Admission: RE | Admit: 2015-11-22 | Discharge: 2015-11-22 | Disposition: A | Discharge status: Home / Self Care | Payer: Medicare Other | Source: Ambulatory Visit | Attending: Radiation Oncology | Admitting: Radiation Oncology

## 2015-11-22 ENCOUNTER — Other Ambulatory Visit: Payer: Self-pay | Admitting: Oncology

## 2015-11-22 DIAGNOSIS — Z510 Encounter for antineoplastic radiation therapy: Secondary | ICD-10-CM | POA: Diagnosis not present

## 2015-11-22 DIAGNOSIS — C210 Malignant neoplasm of anus, unspecified: Secondary | ICD-10-CM

## 2015-11-22 MED ORDER — CAPECITABINE 500 MG PO TABS: 825.0000 mg/m2 | ORAL_TABLET | Freq: Two times a day (BID) | ORAL | Status: DC

## 2015-11-23 ENCOUNTER — Ambulatory Visit: Payer: Medicare Other

## 2015-11-24 ENCOUNTER — Ambulatory Visit: Payer: Medicare Other

## 2015-11-25 ENCOUNTER — Ambulatory Visit
Admission: RE | Admit: 2015-11-25 | Discharge: 2015-11-25 | Disposition: A | Discharge status: Home / Self Care | Payer: Medicare Other | Source: Ambulatory Visit | Attending: Radiation Oncology | Admitting: Radiation Oncology

## 2015-11-25 ENCOUNTER — Inpatient Hospital Stay: Payer: Medicare Other

## 2015-11-25 ENCOUNTER — Ambulatory Visit: Payer: Medicare Other

## 2015-11-25 ENCOUNTER — Inpatient Hospital Stay (HOSPITAL_BASED_OUTPATIENT_CLINIC_OR_DEPARTMENT_OTHER): Payer: Medicare Other | Admitting: Oncology

## 2015-11-25 VITALS — BP 145/77 | HR 60 | Resp 18

## 2015-11-25 VITALS — BP 130/73 | HR 57 | Temp 96.7°F | Resp 16 | Wt 158.5 lb

## 2015-11-25 DIAGNOSIS — D696 Thrombocytopenia, unspecified: Secondary | ICD-10-CM

## 2015-11-25 DIAGNOSIS — C210 Malignant neoplasm of anus, unspecified: Secondary | ICD-10-CM

## 2015-11-25 DIAGNOSIS — R21 Rash and other nonspecific skin eruption: Secondary | ICD-10-CM

## 2015-11-25 DIAGNOSIS — D72819 Decreased white blood cell count, unspecified: Secondary | ICD-10-CM | POA: Diagnosis not present

## 2015-11-25 DIAGNOSIS — R197 Diarrhea, unspecified: Secondary | ICD-10-CM

## 2015-11-25 DIAGNOSIS — Z79899 Other long term (current) drug therapy: Secondary | ICD-10-CM

## 2015-11-25 LAB — COMPREHENSIVE METABOLIC PANEL
ALBUMIN: 3.6 g/dL (ref 3.5–5.0)
ALT: 12 U/L — ABNORMAL LOW (ref 17–63)
ANION GAP: 5 (ref 5–15)
AST: 19 U/L (ref 15–41)
Alkaline Phosphatase: 60 U/L (ref 38–126)
BILIRUBIN TOTAL: 1.2 mg/dL (ref 0.3–1.2)
BUN: 13 mg/dL (ref 6–20)
CO2: 30 mmol/L (ref 22–32)
Calcium: 8.7 mg/dL — ABNORMAL LOW (ref 8.9–10.3)
Chloride: 105 mmol/L (ref 101–111)
Creatinine, Ser: 1.32 mg/dL — ABNORMAL HIGH (ref 0.61–1.24)
GFR calc Af Amer: >60 mL/min (ref >60)
GFR calc non Af Amer: 54 mL/min — ABNORMAL LOW (ref >60)
GLUCOSE: 117 mg/dL — AB (ref 65–99)
POTASSIUM: 3.3 mmol/L — AB (ref 3.5–5.1)
Sodium: 140 mmol/L (ref 135–145)
TOTAL PROTEIN: 6.1 g/dL — AB (ref 6.5–8.1)

## 2015-11-25 LAB — CBC WITH DIFFERENTIAL/PLATELET
BASOS PCT: 0 %
Basophils Absolute: 0 10*3/uL (ref 0–0.1)
EOS ABS: 0.4 10*3/uL (ref 0–0.7)
EOS PCT: 10 %
HCT: 38.5 % — ABNORMAL LOW (ref 40.0–52.0)
HEMOGLOBIN: 13.3 g/dL (ref 13.0–18.0)
Lymphocytes Relative: 7 %
Lymphs Abs: 0.2 10*3/uL — ABNORMAL LOW (ref 1.0–3.6)
MCH: 30.7 pg (ref 26.0–34.0)
MCHC: 34.5 g/dL (ref 32.0–36.0)
MCV: 89 fL (ref 80.0–100.0)
Monocytes Absolute: 0.4 10*3/uL (ref 0.2–1.0)
Monocytes Relative: 12 %
NEUTROS PCT: 71 %
Neutro Abs: 2.6 10*3/uL (ref 1.4–6.5)
PLATELETS: 119 10*3/uL — AB (ref 150–440)
RBC: 4.33 MIL/uL — AB (ref 4.40–5.90)
RDW: 12.7 % (ref 11.5–14.5)
WBC: 3.6 10*3/uL — AB (ref 3.8–10.6)

## 2015-11-25 MED ORDER — PROCHLORPERAZINE MALEATE 10 MG PO TABS
10.0000 mg | ORAL_TABLET | Freq: Once | ORAL | Status: AC
  Administered 2015-11-25: 10 mg via ORAL
  Filled 2015-11-25: qty 1

## 2015-11-25 MED ORDER — SODIUM CHLORIDE 0.9 % IV SOLN
Freq: Once | INTRAVENOUS | Status: AC
  Administered 2015-11-25: 13:00:00 via INTRAVENOUS
  Filled 2015-11-25: qty 1000

## 2015-11-25 MED ORDER — MITOMYCIN CHEMO IV INJECTION 20 MG
10.0000 mg/m2 | Freq: Once | INTRAVENOUS | Status: AC
  Administered 2015-11-25: 19 mg via INTRAVENOUS
  Filled 2015-11-25: qty 38

## 2015-11-25 NOTE — Progress Notes (Signed)
Patient has diarrhea under better control with 4 Imodium a day.  Dr. Baruch Gouty is advised to hold today and tomorrow XRT.

## 2015-11-26 ENCOUNTER — Ambulatory Visit
Admission: RE | Admit: 2015-11-26 | Discharge: 2015-11-26 | Disposition: A | Discharge status: Home / Self Care | Payer: Medicare Other | Source: Ambulatory Visit | Attending: Radiation Oncology | Admitting: Radiation Oncology

## 2015-11-26 ENCOUNTER — Ambulatory Visit: Payer: Medicare Other

## 2015-11-26 ENCOUNTER — Ambulatory Visit: Admission: RE | Admit: 2015-11-26 | Payer: Medicare Other | Source: Ambulatory Visit

## 2015-11-29 ENCOUNTER — Ambulatory Visit
Admission: RE | Admit: 2015-11-29 | Discharge: 2015-11-29 | Disposition: A | Discharge status: Home / Self Care | Payer: Medicare Other | Source: Ambulatory Visit | Attending: Radiation Oncology | Admitting: Radiation Oncology

## 2015-11-29 DIAGNOSIS — Z510 Encounter for antineoplastic radiation therapy: Secondary | ICD-10-CM | POA: Diagnosis not present

## 2015-11-30 ENCOUNTER — Ambulatory Visit
Admission: RE | Admit: 2015-11-30 | Discharge: 2015-11-30 | Disposition: A | Discharge status: Home / Self Care | Payer: Medicare Other | Source: Ambulatory Visit | Attending: Radiation Oncology | Admitting: Radiation Oncology

## 2015-11-30 DIAGNOSIS — Z510 Encounter for antineoplastic radiation therapy: Secondary | ICD-10-CM | POA: Diagnosis not present

## 2015-12-01 ENCOUNTER — Ambulatory Visit
Admission: RE | Admit: 2015-12-01 | Discharge: 2015-12-01 | Disposition: A | Discharge status: Home / Self Care | Payer: Medicare Other | Source: Ambulatory Visit | Attending: Radiation Oncology | Admitting: Radiation Oncology

## 2015-12-01 DIAGNOSIS — Z510 Encounter for antineoplastic radiation therapy: Secondary | ICD-10-CM | POA: Diagnosis not present

## 2015-12-01 NOTE — Progress Notes (Signed)
[place]  Telephone:(336) [phone] Fax:(336) [phone]  ID: Brad Randall OB: 09/20/1945  MR#: [id number]  [id number]  Patient Care Team: Maryland Pink, MD as PCP - General (Family Medicine)  CHIEF COMPLAINT:  Chief Complaint  Patient presents with  . anal cancer    INTERVAL HISTORY: Patient returns to clinic today for repeat laboratory work, further evaluation, and consideration of his next infusion of mitomycin. He recently restarted his Xeloda. His only complaint is of diarrhea which is well-controlled with Imodium. He otherwise feels well.  He has no neurologic complaints.  He denies any recent fevers or illnesses. He has a good appetite and denies weight loss. He denies any chest pain or shortness of breath. He has no nausea, vomiting, or constipation. He has noted no melena or hematochezia. He has no urinary complaints.   REVIEW OF  SYSTEMS:   Review of Systems  Constitutional: Negative.  Negative for fever and weight loss.  HENT: Negative.   Respiratory: Negative.  Negative for cough and shortness of breath.   Cardiovascular: Negative.  Negative for chest pain.  Gastrointestinal: Positive for diarrhea. Negative for abdominal pain, constipation, blood in stool and melena.  Musculoskeletal: Negative.   Skin: Negative for rash.  Neurological: Negative.  Negative for weakness.  Psychiatric/Behavioral: Negative.     As per HPI. Otherwise, a complete review of systems is negatve.  PAST MEDICAL HISTORY: Past Medical History  Diagnosis Date  . Headache(784.0)   . HTN (hypertension)   . Hyperlipidemia   . High cholesterol   . Anxiety and depression   . Cancer ([place])   . Memory loss     PAST SURGICAL HISTORY: Past Surgical History  Procedure Laterality Date  . Colonoscopy with propofol N/A 09/13/2015    Procedure: COLONOSCOPY WITH PROPOFOL;  Surgeon: Hulen Luster, MD;  Location: [facility] ENDOSCOPY;  Service: Gastroenterology;  Laterality: N/A;  . Eus N/A  09/30/2015    Procedure: LOWER ENDOSCOPIC ULTRASOUND (EUS);  Surgeon: Holly Bodily, MD;  Location: [facility] ENDOSCOPY;  Service: Gastroenterology;  Laterality: N/A;    FAMILY HISTORY Family History  Problem Relation Age of Onset  . Cancer    . Cancer Other        ADVANCED DIRECTIVES:    HEALTH MAINTENANCE: Social History  Substance Use Topics  . Smoking status: Never Smoker   . Smokeless tobacco: Never Used  . Alcohol Use: No    Allergies  Allergen Reactions  . Sertraline Other (See Comments)    Makes skin crawl  . Topiramate Other (See Comments)    Mania    Current Outpatient Prescriptions  Medication Sig Dispense Refill  . bisoprolol-hydrochlorothiazide (ZIAC) 2.5-6.25 MG per tablet Take 1 tablet by mouth 2 (two) times daily.     . capecitabine (XELODA) 500 MG tablet Take 3 tablets (1,500 mg total) by mouth 2 (two) times daily after a meal. Take medication for additional cycle of 8 days. 48 tablet 0  . escitalopram (LEXAPRO) 10 MG tablet Take 10 mg by mouth daily.  1  . fluticasone (FLONASE) 50 MCG/ACT nasal spray Place 2 sprays into both nostrils daily as needed for rhinitis.     . hydrocortisone (ANUSOL-HC) 2.5 % rectal cream Place 1 application rectally 2 (two) times daily. 30 g 3  . hydrocortisone (ANUSOL-HC) 25 MG suppository Place 1 suppository (25 mg total) rectally daily. 20 suppository 1  . KLOR-CON 10 10 MEQ tablet Take 10 mEq by mouth daily.  5  . metoCLOPramide (REGLAN)  10 MG tablet Take 10 mg by mouth every 6 (six) hours as needed for nausea.    Marland Kitchen omeprazole (PRILOSEC) 40 MG capsule Take 40 mg by mouth every evening.     . potassium chloride (K-DUR,KLOR-CON) 10 MEQ tablet Take 10 mEq by mouth daily.    . prochlorperazine (COMPAZINE) 10 MG tablet Take 1 tablet (10 mg total) by mouth every 6 (six) hours as needed (Nausea or vomiting). 30 tablet 1  . sodium bicarbonate 650 MG tablet Take 1,300 mg by mouth every evening.     . zonisamide (ZONEGRAN) 25 MG  capsule Take 75 mg by mouth at bedtime.      No current facility-administered medications for this visit.    OBJECTIVE: Filed Vitals:   11/25/15 1120  BP: 130/73  Pulse: 57  Temp: 96.7 F (35.9 C)  Resp: 16     Body mass index is 23.08 kg/(m^2).    ECOG FS:0 - Asymptomatic  General: Well-developed, well-nourished, no acute distress. Eyes: Pink conjunctiva, anicteric sclera. Lungs: Clear to auscultation bilaterally. Heart: Regular rate and rhythm. No rubs, murmurs, or gallops. Abdomen: Soft, nontender, nondistended. No organomegaly noted, normoactive bowel sounds. Musculoskeletal: No edema, cyanosis, or clubbing. Neuro: Alert, answering all questions appropriately. Cranial nerves grossly intact. Skin: No rashes or petechiae noted. Psych: Normal affect.   LAB RESULTS:  Lab Results  Component Value Date   NA 140 11/25/2015   K 3.3* 11/25/2015   CL 105 11/25/2015   CO2 30 11/25/2015   GLUCOSE 117* 11/25/2015   BUN 13 11/25/2015   CREATININE 1.32* 11/25/2015   CALCIUM 8.7* 11/25/2015   PROT 6.1* 11/25/2015   ALBUMIN 3.6 11/25/2015   AST 19 11/25/2015   ALT 12* 11/25/2015   ALKPHOS 60 11/25/2015   BILITOT 1.2 11/25/2015   GFRNONAA 54* 11/25/2015   GFRAA >60 11/25/2015    Lab Results  Component Value Date   WBC 3.6* 11/25/2015   NEUTROABS 2.6 11/25/2015   HGB 13.3 11/25/2015   HCT 38.5* 11/25/2015   MCV 89.0 11/25/2015   PLT 119* 11/25/2015     STUDIES: No results found.  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 Anal cancer: EUS, CT, and PET scan results confirmed local disease without distant metastasis. Patient will not require surgery at this time. Continue daily XRT and oral Xeloda 825 mg/m twice a day Monday through Friday on each day that XRT is given. This will amount to approximately 28 treatment days. Proceed with patient's second infusion of IV mitomycin 10 mg/m today. Patient will complete his XRT on Dec 08, 2015.  Return to clinic in 5 weeks for further evaluation and laboratory work at which point we will also schedule restaging imaging. 2. Diarrhea: Improved with Imodium, monitor. 3. Rash: Improved. Likely secondary to Xeloda. Monitor. 4. Leukopenia: Mild. Secondary to chemotherapy, monitor. 5. Thrombocytopenia: PLT 119 today. Secondary chemotherapy.     Patient expressed understanding and was in agreement with this plan. He also understands that He can call clinic at any time with any questions, concerns, or complaints.    Lloyd Huger, MD   12/01/2015 4:36 PM

## 2015-12-02 ENCOUNTER — Ambulatory Visit
Admission: RE | Admit: 2015-12-02 | Discharge: 2015-12-02 | Disposition: A | Discharge status: Home / Self Care | Payer: Medicare Other | Source: Ambulatory Visit | Attending: Radiation Oncology | Admitting: Radiation Oncology

## 2015-12-02 DIAGNOSIS — Z510 Encounter for antineoplastic radiation therapy: Secondary | ICD-10-CM | POA: Diagnosis not present

## 2015-12-03 ENCOUNTER — Ambulatory Visit
Admission: RE | Admit: 2015-12-03 | Discharge: 2015-12-03 | Disposition: A | Discharge status: Home / Self Care | Payer: Medicare Other | Source: Ambulatory Visit | Attending: Radiation Oncology | Admitting: Radiation Oncology

## 2015-12-03 ENCOUNTER — Ambulatory Visit: Payer: Medicare Other

## 2015-12-03 DIAGNOSIS — Z510 Encounter for antineoplastic radiation therapy: Secondary | ICD-10-CM | POA: Diagnosis not present

## 2015-12-06 ENCOUNTER — Ambulatory Visit
Admission: RE | Admit: 2015-12-06 | Discharge: 2015-12-06 | Disposition: A | Discharge status: Home / Self Care | Payer: Medicare Other | Source: Ambulatory Visit | Attending: Radiation Oncology | Admitting: Radiation Oncology

## 2015-12-06 DIAGNOSIS — Z510 Encounter for antineoplastic radiation therapy: Secondary | ICD-10-CM | POA: Diagnosis not present

## 2015-12-07 ENCOUNTER — Ambulatory Visit: Payer: Medicare Other

## 2015-12-07 ENCOUNTER — Ambulatory Visit
Admission: RE | Admit: 2015-12-07 | Discharge: 2015-12-07 | Disposition: A | Discharge status: Home / Self Care | Payer: Medicare Other | Source: Ambulatory Visit | Attending: Radiation Oncology | Admitting: Radiation Oncology

## 2015-12-07 DIAGNOSIS — Z510 Encounter for antineoplastic radiation therapy: Secondary | ICD-10-CM | POA: Diagnosis not present

## 2015-12-08 ENCOUNTER — Ambulatory Visit: Payer: Medicare Other

## 2015-12-30 ENCOUNTER — Inpatient Hospital Stay: Payer: Medicare Other | Attending: Oncology

## 2015-12-30 ENCOUNTER — Telehealth: Payer: Self-pay

## 2015-12-30 ENCOUNTER — Inpatient Hospital Stay (HOSPITAL_BASED_OUTPATIENT_CLINIC_OR_DEPARTMENT_OTHER): Payer: Medicare Other | Admitting: Oncology

## 2015-12-30 VITALS — BP 123/70 | HR 58 | Temp 95.9°F | Resp 18 | Ht 68.0 in | Wt 157.3 lb

## 2015-12-30 DIAGNOSIS — Z79899 Other long term (current) drug therapy: Secondary | ICD-10-CM

## 2015-12-30 DIAGNOSIS — C210 Malignant neoplasm of anus, unspecified: Secondary | ICD-10-CM | POA: Diagnosis present

## 2015-12-30 DIAGNOSIS — D696 Thrombocytopenia, unspecified: Secondary | ICD-10-CM | POA: Diagnosis not present

## 2015-12-30 DIAGNOSIS — F419 Anxiety disorder, unspecified: Secondary | ICD-10-CM | POA: Diagnosis not present

## 2015-12-30 DIAGNOSIS — Z9221 Personal history of antineoplastic chemotherapy: Secondary | ICD-10-CM | POA: Diagnosis not present

## 2015-12-30 DIAGNOSIS — Z923 Personal history of irradiation: Secondary | ICD-10-CM | POA: Diagnosis not present

## 2015-12-30 DIAGNOSIS — I10 Essential (primary) hypertension: Secondary | ICD-10-CM | POA: Diagnosis not present

## 2015-12-30 DIAGNOSIS — F329 Major depressive disorder, single episode, unspecified: Secondary | ICD-10-CM | POA: Diagnosis not present

## 2015-12-30 DIAGNOSIS — E7800 Pure hypercholesterolemia, unspecified: Secondary | ICD-10-CM | POA: Diagnosis not present

## 2015-12-30 LAB — CBC WITH DIFFERENTIAL/PLATELET
BASOS ABS: 0 10*3/uL (ref 0–0.1)
Basophils Relative: 0 %
EOS PCT: 9 %
Eosinophils Absolute: 0.3 10*3/uL (ref 0–0.7)
HCT: 37.5 % — ABNORMAL LOW (ref 40.0–52.0)
HEMOGLOBIN: 13.2 g/dL (ref 13.0–18.0)
LYMPHS ABS: 0.4 10*3/uL — AB (ref 1.0–3.6)
LYMPHS PCT: 10 %
MCH: 32.9 pg (ref 26.0–34.0)
MCHC: 35.3 g/dL (ref 32.0–36.0)
MCV: 93.2 fL (ref 80.0–100.0)
Monocytes Absolute: 0.3 10*3/uL (ref 0.2–1.0)
Monocytes Relative: 10 %
NEUTROS ABS: 2.4 10*3/uL (ref 1.4–6.5)
NEUTROS PCT: 71 %
PLATELETS: 66 10*3/uL — AB (ref 150–440)
RBC: 4.02 MIL/uL — AB (ref 4.40–5.90)
RDW: 20.3 % — ABNORMAL HIGH (ref 11.5–14.5)
WBC: 3.5 10*3/uL — AB (ref 3.8–10.6)

## 2015-12-30 LAB — COMPREHENSIVE METABOLIC PANEL
ALK PHOS: 69 U/L (ref 38–126)
ALT: 13 U/L — AB (ref 17–63)
AST: 19 U/L (ref 15–41)
Albumin: 3.9 g/dL (ref 3.5–5.0)
Anion gap: 4 — ABNORMAL LOW (ref 5–15)
BUN: 20 mg/dL (ref 6–20)
CALCIUM: 8.9 mg/dL (ref 8.9–10.3)
CHLORIDE: 105 mmol/L (ref 101–111)
CO2: 28 mmol/L (ref 22–32)
CREATININE: 1.22 mg/dL (ref 0.61–1.24)
GFR, EST NON AFRICAN AMERICAN: 60 mL/min — AB (ref >60)
Glucose, Bld: 131 mg/dL — ABNORMAL HIGH (ref 65–99)
Potassium: 3.7 mmol/L (ref 3.5–5.1)
Sodium: 137 mmol/L (ref 135–145)
Total Bilirubin: 1.1 mg/dL (ref 0.3–1.2)
Total Protein: 6.4 g/dL — ABNORMAL LOW (ref 6.5–8.1)

## 2015-12-30 NOTE — Progress Notes (Signed)
Pt reports no more diarrhea, "toasted butt skin has improved significantly from radiation", hemmerroids are going away with preporation H, and fatigue remains but improving.

## 2015-12-30 NOTE — Telephone Encounter (Signed)
Appointment scheduled with nurse practioner that is seeing Dr. Myrna Blazer patients until they have more providers.  Appt on 02/14/16 @ 9:30 Patient is aware.

## 2015-12-30 NOTE — Progress Notes (Signed)
[employer]  Telephone:([phone] Fax:(336) [phone]  ID: Brad Randall OB: September 03, 1945  MR#: [id number]  [id number]  Patient Care Team: Maryland Pink, MD as PCP - General (Family Medicine)  CHIEF COMPLAINT:  Chief Complaint  Patient presents with  . Follow-up    Anal Cancer    INTERVAL HISTORY: Patient returns to clinic today for repeat laboratory work, further evaluation following completion of treatment. He feels well today, states he feels about 50% back to baseline. He does not have any complaints today. He denies pain. He denies diarrhea.  He has no neurologic complaints.  He denies any recent fevers or illnesses. He has a good appetite and denies weight loss. He denies any chest pain or shortness of breath. He has no nausea, vomiting, or constipation. He has noted no melena or hematochezia. He has no urinary complaints. He offers no further specific complaints today.  REVIEW OF  SYSTEMS:   Review of Systems  Constitutional: Negative for fever and weight loss.  HENT: Negative.   Respiratory: Negative.  Negative for cough and shortness of breath.   Cardiovascular: Negative.  Negative for chest pain.  Gastrointestinal: Negative for abdominal pain, diarrhea, constipation, blood in stool and melena.  Musculoskeletal: Negative.   Skin: Negative for rash.  Neurological: Negative.  Negative for weakness.  Psychiatric/Behavioral: Negative.     As per HPI. Otherwise, a complete review of systems is negatve.  PAST MEDICAL HISTORY: Past Medical History  Diagnosis Date  . Headache(784.0)   . HTN (hypertension)   . Hyperlipidemia   . High cholesterol   . Anxiety and depression   . Cancer ([place])   . Memory loss     PAST SURGICAL HISTORY: Past Surgical History  Procedure Laterality Date  . Colonoscopy with propofol N/A 09/13/2015    Procedure: COLONOSCOPY WITH PROPOFOL;  Surgeon: Hulen Luster, MD;  Location: [facility] ENDOSCOPY;  Service: Gastroenterology;   Laterality: N/A;  . Eus N/A 09/30/2015    Procedure: LOWER ENDOSCOPIC ULTRASOUND (EUS);  Surgeon: Holly Bodily, MD;  Location: [facility] ENDOSCOPY;  Service: Gastroenterology;  Laterality: N/A;    FAMILY HISTORY Family History  Problem Relation Age of Onset  . Cancer    . Cancer Other        ADVANCED DIRECTIVES:    HEALTH MAINTENANCE: Social History  Substance Use Topics  . Smoking status: Never Smoker   . Smokeless tobacco: Never Used  . Alcohol Use: No    Allergies  Allergen Reactions  . Sertraline Other (See Comments)    Makes skin crawl  . Topiramate Other (See Comments)    Mania    Current Outpatient Prescriptions  Medication Sig Dispense Refill  . bisoprolol-hydrochlorothiazide (ZIAC) 2.5-6.25 MG per tablet Take 1 tablet by mouth 2 (two) times daily.     . capecitabine (XELODA) 500 MG tablet Take 3 tablets (1,500 mg total) by mouth 2 (two) times daily after a meal. Take medication for additional cycle of 8 days. 48 tablet 0  . escitalopram (LEXAPRO) 10 MG tablet Take 10 mg by mouth daily.  1  . fluticasone (FLONASE) 50 MCG/ACT nasal spray Place 2 sprays into both nostrils daily as needed for rhinitis.     Marland Kitchen KLOR-CON 10 10 MEQ tablet Take 10 mEq by mouth daily.  5  . metoCLOPramide (REGLAN) 10 MG tablet Take 10 mg by mouth every 6 (six) hours as needed for nausea.    Marland Kitchen omeprazole (PRILOSEC) 40 MG capsule Take 40 mg by mouth  every evening.     . potassium chloride (K-DUR,KLOR-CON) 10 MEQ tablet Take 10 mEq by mouth daily.    . prochlorperazine (COMPAZINE) 10 MG tablet     . sodium bicarbonate 650 MG tablet Take 1,300 mg by mouth every evening.     . zonisamide (ZONEGRAN) 25 MG capsule Take 75 mg by mouth at bedtime.      No current facility-administered medications for this visit.    OBJECTIVE: Filed Vitals:   12/30/15 1118  BP: 123/70  Pulse: 58  Temp: 95.9 F (35.5 C)  Resp: 18     Body mass index is 23.92 kg/(m^2).    ECOG FS:0 -  Asymptomatic  General: Well-developed, well-nourished, no acute distress. Eyes: Pink conjunctiva, anicteric sclera. Lungs: Clear to auscultation bilaterally. Heart: Regular rate and rhythm. No rubs, murmurs, or gallops. Abdomen: Soft, nontender, nondistended. No organomegaly noted, normoactive bowel sounds. Musculoskeletal: No edema, cyanosis, or clubbing. Neuro: Alert, answering all questions appropriately. Cranial nerves grossly intact. Skin: No rashes or petechiae noted. Psych: Normal affect.   LAB RESULTS:  Lab Results  Component Value Date   NA 137 12/30/2015   K 3.7 12/30/2015   CL 105 12/30/2015   CO2 28 12/30/2015   GLUCOSE 131* 12/30/2015   BUN 20 12/30/2015   CREATININE 1.22 12/30/2015   CALCIUM 8.9 12/30/2015   PROT 6.4* 12/30/2015   ALBUMIN 3.9 12/30/2015   AST 19 12/30/2015   ALT 13* 12/30/2015   ALKPHOS 69 12/30/2015   BILITOT 1.1 12/30/2015   GFRNONAA 60* 12/30/2015   GFRAA >60 12/30/2015    Lab Results  Component Value Date   WBC 3.5* 12/30/2015   NEUTROABS 2.4 12/30/2015   HGB 13.2 12/30/2015   HCT 37.5* 12/30/2015   MCV 93.2 12/30/2015   PLT 66* 12/30/2015     STUDIES: No results found.  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 Anal cancer:  Patient completed his chemotherapy with 5-fu and mitomycin in early May 2017. He completed XRT on Dec 08, 2015.  Return to clinic in 3-4 weeks for PET scan results, further evaluation and laboratory work.  2.Thrombocytopenia: PLT 66 today, likely residual from chemo and XRT. Monitor    Patient expressed understanding and was in agreement with this plan. He also understands that He can call clinic at any time with any questions, concerns, or complaints.    Mayra Reel, NP   12/30/2015 11:57 AM  Patient was seen and evaluated independently and I agree with the assessment and plan as dictated above.  Lloyd Huger, MD 01/03/2016 11:07 PM

## 2016-01-06 ENCOUNTER — Encounter: Payer: Self-pay | Admitting: Radiation Oncology

## 2016-01-06 ENCOUNTER — Ambulatory Visit
Admission: RE | Admit: 2016-01-06 | Discharge: 2016-01-06 | Disposition: A | Discharge status: Home / Self Care | Payer: Medicare Other | Source: Ambulatory Visit | Attending: Radiation Oncology | Admitting: Radiation Oncology

## 2016-01-06 VITALS — BP 125/72 | HR 59 | Temp 95.2°F | Resp 20 | Wt 157.3 lb

## 2016-01-06 DIAGNOSIS — Z923 Personal history of irradiation: Secondary | ICD-10-CM | POA: Diagnosis not present

## 2016-01-06 DIAGNOSIS — Z85048 Personal history of other malignant neoplasm of rectum, rectosigmoid junction, and anus: Secondary | ICD-10-CM | POA: Insufficient documentation

## 2016-01-06 DIAGNOSIS — C210 Malignant neoplasm of anus, unspecified: Secondary | ICD-10-CM

## 2016-01-06 NOTE — Progress Notes (Signed)
Radiation Oncology Follow up Note  Name: Brad Randall   Date:   01/06/2016 MRN:  [id number] DOB: [birth year]    This 70 y.o. male presents to the clinic today for one-month follow-up for combined modality treatment for anal cancer.  REFERRING PROVIDER: Maryland Pink, MD  HPI: Patient is a 70 year old male now one month out from combined modality treatment for a T2 N0 M0 squamous cell carcinoma of the anus. He is seen today in routine follow-up and is doing well. He's having no significant residual side effects no diarrhea dysuria or any other GI/GU complaints. He is seeing gastroneurology in about 2 weeks has no follow-up with the surgeon although I've recommended possible referral back to Dr. Tamala Julian at some time..  COMPLICATIONS OF TREATMENT: none  FOLLOW UP COMPLIANCE: keeps appointments   PHYSICAL EXAM:  BP 125/72 mmHg  Pulse 59  Temp(Src) 95.2 F (35.1 C)  Resp 20  Wt 157 lb 4.8 oz (71.35 kg) On rectal exam rectal sphincter tone is good still has a slight protruding hemorrhoid. No distinct nodularity or mass is identified rectal exam was otherwise totally unremarkable no inguinal adenopathy is detected. Well-developed well-nourished patient in NAD. HEENT reveals PERLA, EOMI, discs not visualized.  Oral cavity is clear. No oral mucosal lesions are identified. Neck is clear without evidence of cervical or supraclavicular adenopathy. Lungs are clear to A&P. Cardiac examination is essentially unremarkable with regular rate and rhythm without murmur rub or thrill. Abdomen is benign with no organomegaly or masses noted. Motor sensory and DTR levels are equal and symmetric in the upper and lower extremities. Cranial nerves II through XII are grossly intact. Proprioception is intact. No peripheral adenopathy or edema is identified. No motor or sensory levels are noted. Crude visual fields are within normal range.  RADIOLOGY RESULTS: No current films for review  PLAN: Present time patient is  doing well with no evidence of disease. He will follow up with gastroenterology. He may need repeat biopsy at some time should any indication [place] that. I'm please was overall progress I've asked to see him back in 4 months for follow-up. Patient may benefit from a CT scan in the near future. He continues close follow-up care with medical oncology.  I would like to take this opportunity to thank you for allowing me to participate in the care of your patient.Brad Peaks., MD

## 2016-01-27 ENCOUNTER — Ambulatory Visit
Admission: RE | Admit: 2016-01-27 | Discharge: 2016-01-27 | Disposition: A | Discharge status: Home / Self Care | Payer: Medicare Other | Source: Ambulatory Visit | Attending: Oncology | Admitting: Oncology

## 2016-01-27 DIAGNOSIS — C210 Malignant neoplasm of anus, unspecified: Secondary | ICD-10-CM | POA: Insufficient documentation

## 2016-01-27 LAB — GLUCOSE, CAPILLARY: Glucose-Capillary: 97 mg/dL (ref 65–99)

## 2016-01-27 MED ORDER — FLUDEOXYGLUCOSE F - 18 (FDG) INJECTION
12.8500 | Freq: Once | INTRAVENOUS | Status: AC | PRN
  Administered 2016-01-27: 12.85 via INTRAVENOUS

## 2016-01-31 ENCOUNTER — Telehealth: Payer: Self-pay | Admitting: *Deleted

## 2016-01-31 ENCOUNTER — Inpatient Hospital Stay: Payer: Medicare Other | Attending: Oncology | Admitting: Oncology

## 2016-01-31 ENCOUNTER — Inpatient Hospital Stay: Payer: Medicare Other

## 2016-01-31 VITALS — BP 136/81 | HR 65 | Temp 97.1°F | Resp 18 | Wt 160.3 lb

## 2016-01-31 DIAGNOSIS — N289 Disorder of kidney and ureter, unspecified: Secondary | ICD-10-CM | POA: Insufficient documentation

## 2016-01-31 DIAGNOSIS — Z79899 Other long term (current) drug therapy: Secondary | ICD-10-CM | POA: Diagnosis not present

## 2016-01-31 DIAGNOSIS — I10 Essential (primary) hypertension: Secondary | ICD-10-CM | POA: Diagnosis not present

## 2016-01-31 DIAGNOSIS — C210 Malignant neoplasm of anus, unspecified: Secondary | ICD-10-CM | POA: Insufficient documentation

## 2016-01-31 DIAGNOSIS — F329 Major depressive disorder, single episode, unspecified: Secondary | ICD-10-CM | POA: Diagnosis not present

## 2016-01-31 DIAGNOSIS — R944 Abnormal results of kidney function studies: Secondary | ICD-10-CM | POA: Insufficient documentation

## 2016-01-31 DIAGNOSIS — E7800 Pure hypercholesterolemia, unspecified: Secondary | ICD-10-CM | POA: Insufficient documentation

## 2016-01-31 DIAGNOSIS — F419 Anxiety disorder, unspecified: Secondary | ICD-10-CM | POA: Diagnosis not present

## 2016-01-31 DIAGNOSIS — D696 Thrombocytopenia, unspecified: Secondary | ICD-10-CM | POA: Insufficient documentation

## 2016-01-31 LAB — COMPREHENSIVE METABOLIC PANEL
ALBUMIN: 3.7 g/dL (ref 3.5–5.0)
ALT: 13 U/L — ABNORMAL LOW (ref 17–63)
AST: 19 U/L (ref 15–41)
Alkaline Phosphatase: 56 U/L (ref 38–126)
Anion gap: 4 — ABNORMAL LOW (ref 5–15)
BUN: 18 mg/dL (ref 6–20)
CHLORIDE: 108 mmol/L (ref 101–111)
CO2: 27 mmol/L (ref 22–32)
Calcium: 8.9 mg/dL (ref 8.9–10.3)
Creatinine, Ser: 1.29 mg/dL — ABNORMAL HIGH (ref 0.61–1.24)
GFR calc Af Amer: >60 mL/min (ref >60)
GFR calc non Af Amer: 56 mL/min — ABNORMAL LOW (ref >60)
GLUCOSE: 110 mg/dL — AB (ref 65–99)
POTASSIUM: 4 mmol/L (ref 3.5–5.1)
SODIUM: 139 mmol/L (ref 135–145)
TOTAL PROTEIN: 6 g/dL — AB (ref 6.5–8.1)
Total Bilirubin: 1 mg/dL (ref 0.3–1.2)

## 2016-01-31 LAB — CBC WITH DIFFERENTIAL/PLATELET
BASOS ABS: 0 10*3/uL (ref 0–0.1)
BASOS PCT: 1 %
EOS PCT: 7 %
Eosinophils Absolute: 0.2 10*3/uL (ref 0–0.7)
HCT: 39.8 % — ABNORMAL LOW (ref 40.0–52.0)
Hemoglobin: 14 g/dL (ref 13.0–18.0)
Lymphocytes Relative: 13 %
Lymphs Abs: 0.4 10*3/uL — ABNORMAL LOW (ref 1.0–3.6)
MCH: 34.1 pg — ABNORMAL HIGH (ref 26.0–34.0)
MCHC: 35.2 g/dL (ref 32.0–36.0)
MCV: 96.8 fL (ref 80.0–100.0)
MONO ABS: 0.4 10*3/uL (ref 0.2–1.0)
Monocytes Relative: 14 %
NEUTROS ABS: 2 10*3/uL (ref 1.4–6.5)
Neutrophils Relative %: 65 %
PLATELETS: 120 10*3/uL — AB (ref 150–440)
RBC: 4.11 MIL/uL — ABNORMAL LOW (ref 4.40–5.90)
RDW: 15.4 % — AB (ref 11.5–14.5)
WBC: 3.1 10*3/uL — AB (ref 3.8–10.6)

## 2016-01-31 NOTE — Progress Notes (Signed)
Patient doing well. Still has some fatigue and heat intolerance. 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 Patient eating a lot of salads and stools are softer. Oral chemo caused muscle pain and weakness; Still lingering. Going to exercise classes and CARE program.

## 2016-01-31 NOTE — Telephone Encounter (Signed)
Called to report that he has an appt with Dr Myrna Blazer NP on 7/17

## 2016-01-31 NOTE — Progress Notes (Signed)
[place]  Telephone:(336) [phone] Fax:(336) [phone]  ID: Brad Randall OB: 05-May-1946  MR#: [id number]  [id number]  Patient Care Team: Maryland Pink, MD as PCP - General (Family Medicine)  CHIEF COMPLAINT:  No chief complaint on file.   INTERVAL HISTORY: Patient returns to clinic today for repeat laboratory work and discussion of his PET scan results. He currently feels well and is asymptomatic. He has noticed occasional heat intolerance when working outside. He has no neurologic complaints.  He denies any recent fevers or illnesses. He has a good appetite and denies weight loss. He denies any chest pain or shortness of breath. He has no nausea, vomiting, diarrhea, or constipation. He has noted no melena or hematochezia. He has no urinary complaints. He offers no further specific complaints today.  REVIEW OF  SYSTEMS:   Review of Systems  Constitutional: Negative for fever and weight loss.  HENT: Negative.   Respiratory: Negative.  Negative for cough and shortness of breath.   Cardiovascular: Negative.  Negative for chest pain.  Gastrointestinal: Negative for abdominal pain, diarrhea, constipation, blood in stool and melena.  Musculoskeletal: Negative.   Skin: Negative for rash.  Neurological: Negative.  Negative for weakness.  Psychiatric/Behavioral: Negative.     As per HPI. Otherwise, a complete review of systems is negatve.  PAST MEDICAL HISTORY: Past Medical History  Diagnosis Date  . Headache(784.0)   . HTN (hypertension)   . Hyperlipidemia   . High cholesterol   . Anxiety and depression   . Cancer ([place])   . Memory loss     PAST SURGICAL HISTORY: Past Surgical History  Procedure Laterality Date  . Colonoscopy with propofol N/A 09/13/2015    Procedure: COLONOSCOPY WITH PROPOFOL;  Surgeon: Hulen Luster, MD;  Location: [facility] ENDOSCOPY;  Service: Gastroenterology;  Laterality: N/A;  . Eus N/A 09/30/2015    Procedure: LOWER ENDOSCOPIC ULTRASOUND  (EUS);  Surgeon: Holly Bodily, MD;  Location: [facility] ENDOSCOPY;  Service: Gastroenterology;  Laterality: N/A;    FAMILY HISTORY Family History  Problem Relation Age of Onset  . Cancer    . Cancer Other        ADVANCED DIRECTIVES:    HEALTH MAINTENANCE: Social History  Substance Use Topics  . Smoking status: Never Smoker   . Smokeless tobacco: Never Used  . Alcohol Use: No    Allergies  Allergen Reactions  . Sertraline Other (See Comments)    Makes skin crawl  . Topiramate Other (See Comments)    Mania    Current Outpatient Prescriptions  Medication Sig Dispense Refill  . bisoprolol-hydrochlorothiazide (ZIAC) 2.5-6.25 MG per tablet Take 1 tablet by mouth daily.     Marland Kitchen escitalopram (LEXAPRO) 10 MG tablet Take 10 mg by mouth daily.  1  . fluticasone (FLONASE) 50 MCG/ACT nasal spray Place 2 sprays into both nostrils daily as needed for rhinitis.     Marland Kitchen KLOR-CON 10 10 MEQ tablet Take 10 mEq by mouth daily.  5  . metoCLOPramide (REGLAN) 10 MG tablet Take 10 mg by mouth every 6 (six) hours as needed for nausea. Reported on 01/06/2016    . omeprazole (PRILOSEC) 40 MG capsule Take 40 mg by mouth every evening.     . polyethylene glycol (MIRALAX / GLYCOLAX) packet Take 17 g by mouth daily as needed.    . potassium chloride (K-DUR,KLOR-CON) 10 MEQ tablet Take 10 mEq by mouth daily.    . prochlorperazine (COMPAZINE) 10 MG tablet Reported on 01/06/2016    .  zonisamide (ZONEGRAN) 25 MG capsule Take 75 mg by mouth at bedtime.     . sodium bicarbonate 650 MG tablet Take 1,300 mg by mouth every evening. Reported on 01/31/2016     No current facility-administered medications for this visit.    OBJECTIVE: Filed Vitals:   01/31/16 1125  BP: 136/81  Pulse: 65  Temp: 97.1 F (36.2 C)  Resp: 18     Body mass index is 24.38 kg/(m^2).    ECOG FS:0 - Asymptomatic  General: Well-developed, well-nourished, no acute distress. Eyes: Pink conjunctiva, anicteric sclera. Lungs: Clear to  auscultation bilaterally. Heart: Regular rate and rhythm. No rubs, murmurs, or gallops. Abdomen: Soft, nontender, nondistended. No organomegaly noted, normoactive bowel sounds. Musculoskeletal: No edema, cyanosis, or clubbing. Neuro: Alert, answering all questions appropriately. Cranial nerves grossly intact. Skin: No rashes or petechiae noted. Psych: Normal affect.   LAB RESULTS:  Lab Results  Component Value Date   NA 139 01/31/2016   K 4.0 01/31/2016   CL 108 01/31/2016   CO2 27 01/31/2016   GLUCOSE 110* 01/31/2016   BUN 18 01/31/2016   CREATININE 1.29* 01/31/2016   CALCIUM 8.9 01/31/2016   PROT 6.0* 01/31/2016   ALBUMIN 3.7 01/31/2016   AST 19 01/31/2016   ALT 13* 01/31/2016   ALKPHOS 56 01/31/2016   BILITOT 1.0 01/31/2016   GFRNONAA 56* 01/31/2016   GFRAA >60 01/31/2016    Lab Results  Component Value Date   WBC 3.1* 01/31/2016   NEUTROABS 2.0 01/31/2016   HGB 14.0 01/31/2016   HCT 39.8* 01/31/2016   MCV 96.8 01/31/2016   PLT 120* 01/31/2016     STUDIES: Nm Pet Image Restag (ps) Skull Base To Thigh  01/27/2016  CLINICAL DATA:  Subsequent treatment strategy for anal carcinoma. EXAM: NUCLEAR MEDICINE PET SKULL BASE TO THIGH TECHNIQUE: 12.85 mCi F-18 FDG was injected intravenously. Full-ring PET imaging was performed from the skull base to thigh after the radiotracer. CT data was obtained and used for attenuation correction and anatomic localization. FASTING BLOOD GLUCOSE:  Value: 97 mg/dl COMPARISON:  PET-CT 10/08/2015 FINDINGS: NECK No hypermetabolic lymph nodes in the neck. CHEST No hypermetabolic mediastinal or hilar nodes. No suspicious pulmonary nodules on the CT scan. ABDOMEN/PELVIS No abnormal hypermetabolic activity within the liver, pancreas, adrenal glands, or spleen. No hypermetabolic lymph nodes in the abdomen or pelvis. Prior examination shadows marked hypermetabolism in the region and a large anal polyp. SUV max was 14. No polyp is identified. Minimal  residual metabolic activity with SUV max of 3.3. This is likely related to radiation. This would suggest an excellent response to therapy. SKELETON No focal hypermetabolic activity to suggest skeletal metastasis. IMPRESSION: Resolution of the anal polyp and hypermetabolism demonstrated on the prior PET-CT suggesting an excellent response treatment. No findings to suggest local spread or metastatic disease. Electronically Signed   By: Marijo Sanes M.D.   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 Anal cancer:  PET scan results of January 27, 2016 reviewed independently and reported as above with resolution of PET positive anal lesion suggesting an excellent response to treatment. No further intervention is needed at this time. Patient states he has an appointment with GI in the next several weeks for direct visualization. Per NCCN guidelines, patient will require an anoscope every 6-12 months for the next 2 years. Will also do abdominal and pelvic CT with contrast annually for the next 3  years. Return to clinic in 3 months with repeat laboratory work and further evaluation.   2.Thrombocytopenia: Improved. Mild, continue to monitor. 3. Renal insufficiency: Creatinine mildly elevated today, monitor.     Patient expressed understanding and was in agreement with this plan. He also understands that He can call clinic at any time with any questions, concerns, or complaints.    Lloyd Huger, MD   01/31/2016 12:46 PM

## 2016-04-20 ENCOUNTER — Encounter: Payer: Self-pay | Admitting: *Deleted

## 2016-04-30 NOTE — Progress Notes (Signed)
[place]  Telephone:(336) [phone] Fax:(336) [phone]  ID: Brad Randall OB: [birth year]  MR#: [id number]  [id number]  Patient Care Team: Maryland Pink, MD as PCP - General (Family Medicine)  CHIEF COMPLAINT: Stage II squamous cell cancer of the anus  INTERVAL HISTORY: Patient returns to clinic today for repeat laboratory work and further evaluation. He has occasional incontinence of stool, but otherwise feels well. He was recently evaluated at [facility], but we do not have the results of this testing. He has no neurologic complaints.  He denies any recent fevers or illnesses. He has a good appetite and denies weight loss. He denies any chest pain or shortness of breath. He has no nausea, vomiting, diarrhea, or constipation. He has noted no melena or hematochezia. He has no urinary complaints. He offers no further specific complaints today.  REVIEW OF  SYSTEMS:   Review of Systems  Constitutional: Negative for fever and weight loss.  HENT: Negative.   Respiratory: Negative.  Negative for cough and shortness of breath.   Cardiovascular: Negative.  Negative for chest pain.  Gastrointestinal: Negative for abdominal pain, blood in stool, constipation, diarrhea and melena.  Musculoskeletal: Negative.   Skin: Negative for rash.  Neurological: Negative.  Negative for weakness.  Psychiatric/Behavioral: Negative.     As per HPI. Otherwise, a complete review of systems is negative.  PAST MEDICAL HISTORY: Past Medical History:  Diagnosis Date  . Anxiety and depression   . Cancer ([place])   . Headache(784.0)   . High cholesterol   . HTN (hypertension)   . Hyperlipidemia   . Memory loss     PAST SURGICAL HISTORY: Past Surgical History:  Procedure Laterality Date  . COLONOSCOPY WITH PROPOFOL N/A 09/13/2015   Procedure: COLONOSCOPY WITH PROPOFOL;  Surgeon: Hulen Luster, MD;  Location: [facility] ENDOSCOPY;  Service: Gastroenterology;  Laterality: N/A;  . EUS N/A 09/30/2015   Procedure: LOWER ENDOSCOPIC ULTRASOUND (EUS);  Surgeon: Holly Bodily, MD;  Location: [facility] ENDOSCOPY;  Service: Gastroenterology;  Laterality: N/A;    FAMILY HISTORY Family History  Problem Relation Age of Onset  . Cancer    . Cancer Other        ADVANCED DIRECTIVES:    HEALTH MAINTENANCE: Social History  Substance Use Topics  . Smoking status: Never Smoker  . Smokeless tobacco: Never Used  . Alcohol use No    Allergies  Allergen Reactions  . Sertraline Other (See Comments)    Makes skin crawl  . Topiramate Other (See Comments)    Mania    Current Outpatient Prescriptions  Medication Sig Dispense Refill  . bisoprolol-hydrochlorothiazide (ZIAC) 2.5-6.25 MG per tablet Take 1 tablet by mouth daily.     Marland Kitchen escitalopram (LEXAPRO) 10 MG tablet Take 10 mg by mouth daily.  1  . fluticasone (FLONASE) 50 MCG/ACT nasal spray Place 2 sprays into both nostrils daily as needed for rhinitis.     Marland Kitchen KLOR-CON 10 10 MEQ tablet Take 10 mEq by mouth daily.  5  . metoCLOPramide (REGLAN) 10 MG tablet Take 10 mg by mouth every 6 (six) hours as needed for nausea. Reported on 01/06/2016    . omeprazole (PRILOSEC) 40 MG capsule Take 40 mg by mouth every evening.     . polyethylene glycol (MIRALAX / GLYCOLAX) packet Take 17 g by mouth daily as needed.    . potassium chloride (K-DUR,KLOR-CON) 10 MEQ tablet Take 10 mEq by mouth daily.    . prochlorperazine (COMPAZINE) 10 MG tablet Take 10  mg by mouth every 6 (six) hours as needed. Reported on 01/06/2016    . sodium bicarbonate 650 MG tablet Take 1,300 mg by mouth every evening. Reported on 01/31/2016    . zonisamide (ZONEGRAN) 25 MG capsule Take 75 mg by mouth at bedtime.      No current facility-administered medications for this visit.     OBJECTIVE: Vitals:   05/01/16 1014  BP: (!) 154/85  Pulse: (!) 55  Resp: 18  Temp: (!) 96.3 F (35.7 C)     Body mass index is 24.2 kg/m.    ECOG FS:0 - Asymptomatic  General: Well-developed,  well-nourished, no acute distress. Eyes: Pink conjunctiva, anicteric sclera. Lungs: Clear to auscultation bilaterally. Heart: Regular rate and rhythm. No rubs, murmurs, or gallops. Abdomen: Soft, nontender, nondistended. No organomegaly noted, normoactive bowel sounds. Musculoskeletal: No edema, cyanosis, or clubbing. Neuro: Alert, answering all questions appropriately. Cranial nerves grossly intact. Skin: No rashes or petechiae noted. Psych: Normal affect.   LAB RESULTS:  Lab Results  Component Value Date   NA 138 05/01/2016   K 3.8 05/01/2016   CL 105 05/01/2016   CO2 29 05/01/2016   GLUCOSE 102 (H) 05/01/2016   BUN 11 05/01/2016   CREATININE 1.32 (H) 05/01/2016   CALCIUM 9.1 05/01/2016   PROT 6.8 05/01/2016   ALBUMIN 4.3 05/01/2016   AST 22 05/01/2016   ALT 15 (L) 05/01/2016   ALKPHOS 63 05/01/2016   BILITOT 0.9 05/01/2016   GFRNONAA 54 (L) 05/01/2016   GFRAA >60 05/01/2016    Lab Results  Component Value Date   WBC 2.6 (L) 05/01/2016   NEUTROABS 1.8 05/01/2016   HGB 15.9 05/01/2016   HCT 45.2 05/01/2016   MCV 91.2 05/01/2016   PLT 120 (L) 05/01/2016     STUDIES: No results found.  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 No further intervention is needed at this time. Patient was evaluated at [facility] recently with direct visualization as well as manometry, but we do not have these results in hand. Per NCCN guidelines, patient will require an anoscope every 6-12 months for the next 2 years. Will also do abdominal and pelvic CT with contrast annually for the next 3 years. His next imaging with CT scan will be due in June 2018.  Return to clinic in 3 months with repeat laboratory work and further evaluation.   2.Thrombocytopenia: Improved. Mild,  continue to monitor. 3. Renal insufficiency: Creatinine mildly elevated today, monitor.    4. 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 Monitor.  Patient expressed understanding and was in agreement with this plan. He also understands that He can call clinic at any time with any questions, concerns, or complaints.    Lloyd Huger, MD   05/02/2016 11:13 AM

## 2016-05-01 ENCOUNTER — Inpatient Hospital Stay: Payer: Medicare Other

## 2016-05-01 ENCOUNTER — Inpatient Hospital Stay: Payer: Medicare Other | Attending: Oncology | Admitting: Oncology

## 2016-05-01 VITALS — BP 154/85 | HR 55 | Temp 96.3°F | Resp 18 | Wt 159.2 lb

## 2016-05-01 DIAGNOSIS — C210 Malignant neoplasm of anus, unspecified: Secondary | ICD-10-CM | POA: Diagnosis present

## 2016-05-01 DIAGNOSIS — D72819 Decreased white blood cell count, unspecified: Secondary | ICD-10-CM

## 2016-05-01 DIAGNOSIS — I10 Essential (primary) hypertension: Secondary | ICD-10-CM | POA: Diagnosis not present

## 2016-05-01 DIAGNOSIS — D696 Thrombocytopenia, unspecified: Secondary | ICD-10-CM | POA: Diagnosis not present

## 2016-05-01 DIAGNOSIS — E785 Hyperlipidemia, unspecified: Secondary | ICD-10-CM | POA: Insufficient documentation

## 2016-05-01 DIAGNOSIS — Z79899 Other long term (current) drug therapy: Secondary | ICD-10-CM

## 2016-05-01 DIAGNOSIS — E7800 Pure hypercholesterolemia, unspecified: Secondary | ICD-10-CM | POA: Diagnosis not present

## 2016-05-01 DIAGNOSIS — N289 Disorder of kidney and ureter, unspecified: Secondary | ICD-10-CM | POA: Diagnosis not present

## 2016-05-01 DIAGNOSIS — Z923 Personal history of irradiation: Secondary | ICD-10-CM

## 2016-05-01 DIAGNOSIS — F419 Anxiety disorder, unspecified: Secondary | ICD-10-CM | POA: Diagnosis not present

## 2016-05-01 DIAGNOSIS — Z9221 Personal history of antineoplastic chemotherapy: Secondary | ICD-10-CM | POA: Diagnosis not present

## 2016-05-01 LAB — COMPREHENSIVE METABOLIC PANEL
ALT: 15 U/L — AB (ref 17–63)
AST: 22 U/L (ref 15–41)
Albumin: 4.3 g/dL (ref 3.5–5.0)
Alkaline Phosphatase: 63 U/L (ref 38–126)
Anion gap: 4 — ABNORMAL LOW (ref 5–15)
BILIRUBIN TOTAL: 0.9 mg/dL (ref 0.3–1.2)
BUN: 11 mg/dL (ref 6–20)
CO2: 29 mmol/L (ref 22–32)
CREATININE: 1.32 mg/dL — AB (ref 0.61–1.24)
Calcium: 9.1 mg/dL (ref 8.9–10.3)
Chloride: 105 mmol/L (ref 101–111)
GFR, EST NON AFRICAN AMERICAN: 54 mL/min — AB (ref >60)
Glucose, Bld: 102 mg/dL — ABNORMAL HIGH (ref 65–99)
Potassium: 3.8 mmol/L (ref 3.5–5.1)
Sodium: 138 mmol/L (ref 135–145)
TOTAL PROTEIN: 6.8 g/dL (ref 6.5–8.1)

## 2016-05-01 LAB — CBC WITH DIFFERENTIAL/PLATELET
BASOS ABS: 0 10*3/uL (ref 0–0.1)
Basophils Relative: 1 %
EOS PCT: 4 %
Eosinophils Absolute: 0.1 10*3/uL (ref 0–0.7)
HEMATOCRIT: 45.2 % (ref 40.0–52.0)
Hemoglobin: 15.9 g/dL (ref 13.0–18.0)
LYMPHS ABS: 0.4 10*3/uL — AB (ref 1.0–3.6)
LYMPHS PCT: 15 %
MCH: 32 pg (ref 26.0–34.0)
MCHC: 35.1 g/dL (ref 32.0–36.0)
MCV: 91.2 fL (ref 80.0–100.0)
MONO ABS: 0.2 10*3/uL (ref 0.2–1.0)
Monocytes Relative: 10 %
NEUTROS ABS: 1.8 10*3/uL (ref 1.4–6.5)
Neutrophils Relative %: 70 %
PLATELETS: 120 10*3/uL — AB (ref 150–440)
RBC: 4.96 MIL/uL (ref 4.40–5.90)
RDW: 11.8 % (ref 11.5–14.5)
WBC: 2.6 10*3/uL — ABNORMAL LOW (ref 3.8–10.6)

## 2016-05-01 NOTE — Progress Notes (Signed)
States is feeling tired today due to doing too many activities.

## 2016-05-26 ENCOUNTER — Ambulatory Visit: Payer: Self-pay | Admitting: Radiation Oncology

## 2016-05-30 ENCOUNTER — Ambulatory Visit
Admission: RE | Admit: 2016-05-30 | Discharge: 2016-05-30 | Disposition: A | Discharge status: Home / Self Care | Payer: Medicare Other | Source: Ambulatory Visit | Attending: Radiation Oncology | Admitting: Radiation Oncology

## 2016-05-30 ENCOUNTER — Encounter: Payer: Self-pay | Admitting: Radiation Oncology

## 2016-05-30 VITALS — BP 168/95 | HR 68 | Temp 97.5°F | Wt 161.5 lb

## 2016-05-30 DIAGNOSIS — Z923 Personal history of irradiation: Secondary | ICD-10-CM | POA: Insufficient documentation

## 2016-05-30 DIAGNOSIS — D72819 Decreased white blood cell count, unspecified: Secondary | ICD-10-CM | POA: Insufficient documentation

## 2016-05-30 DIAGNOSIS — Z85048 Personal history of other malignant neoplasm of rectum, rectosigmoid junction, and anus: Secondary | ICD-10-CM | POA: Diagnosis not present

## 2016-05-30 DIAGNOSIS — C210 Malignant neoplasm of anus, unspecified: Secondary | ICD-10-CM

## 2016-05-30 DIAGNOSIS — Z9221 Personal history of antineoplastic chemotherapy: Secondary | ICD-10-CM | POA: Insufficient documentation

## 2016-05-30 NOTE — Progress Notes (Signed)
Radiation Oncology Follow up Note  Name: Brad Randall   Date:   05/30/2016 MRN:  [id number] DOB: 12-22-1945    This 70 y.o. male presents to the clinic today for 5 month follow-up status post concurrent chemoradiation for anal cancer.  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 He is seen today in routine follow-up and is doing extremely well. He's continued to see GI reporting no evidence of disease. He specifically denies diarrhea dysuria rectal incontinence or any other GI/GU complaints.. Patient had a PET CT scan back in June 2017 showing no evidence of hypermetabolic activity consistent with excellent response. He's also being followed closely with anoscopic examinations at [facility] every 6 months. He does have mild leukopenia being followed by medical oncology.  COMPLICATIONS OF TREATMENT: none  FOLLOW UP COMPLIANCE: keeps appointments   PHYSICAL EXAM:  BP (!) 168/95   Pulse 68   Temp 97.5 F (36.4 C)   Wt 161 lb 7.8 oz (73.2 kg)   BMI 24.55 kg/m  On rectal exam rectal sphincter tone is good. No evidence of mass or nodularity in anal region is noted. No evidence of inguinal adenopathy is noted. Well-developed well-nourished patient in NAD. HEENT reveals PERLA, EOMI, discs not visualized.  Oral cavity is clear. No oral mucosal lesions are identified. Neck is clear without evidence of cervical or supraclavicular adenopathy. Lungs are clear to A&P. Cardiac examination is essentially unremarkable with regular rate and rhythm without murmur rub or thrill. Abdomen is benign with no organomegaly or masses noted. Motor sensory and DTR levels are equal and symmetric in the upper and lower extremities. Cranial nerves II through XII are grossly intact. Proprioception is intact. No peripheral adenopathy or edema is  identified. No motor or sensory levels are noted. Crude visual fields are within normal range.  RADIOLOGY RESULTS: No current films for review reports from [facility] and PET scan are reviewed and compatible with the above-stated findings  PLAN: Present time he continues to do well with no evidence of disease. I'm please was overall progress. He Continues close follow-up examinations at [facility]. I've asked to see him back in 6 months for follow-up. Patient knows to call sooner with any concerns.  I would like to take this opportunity to thank you for allowing me to participate in the care of your patient.Armstead Peaks., MD

## 2016-08-07 NOTE — Progress Notes (Signed)
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 His previous occasional incontinence of stool has resolved, after initial "learning curve".  He reports no nausea, vomiting, diarrhea, or constipation.  He reports having 2-3 formed BM's per day. He has noted no melena or hematochezia. He has no urinary complaints. He reports new onset peripheral vision movement and extra-auditory sensations of hearing music in fan motor sounds. He reports feeling anxious, particularly in setting of upcoming life decisions about next phase of life.  He denies any recent fevers or illnesses. He has a fair appetite, good nutritional intake, and denies weight loss. He denies any chest pain or shortness of breath.  He reports slow onset of left shoulder pain over past month, improving with OTC Osteo-BiFlex. He offers no further specific complaints today.  REVIEW OF  SYSTEMS:   Review of Systems  Constitutional: Negative for fever and weight loss.  HENT:       Hissing sounds; extra-auditory musical sounds heard in fan motor sounds  Eyes:       Peripheral "fluttery" movements visualized  Respiratory: Negative.  Negative for cough and shortness of breath.   Cardiovascular: Negative.  Negative for chest pain.  Gastrointestinal: Negative.  Negative for abdominal pain, blood in stool, constipation, diarrhea and melena.  Musculoskeletal: Positive for myalgias. Negative for back pain and neck pain.  Skin: Negative for rash.  Neurological: Negative.  Negative for tingling, weakness and headaches.  Psychiatric/Behavioral: The patient is nervous/anxious. The patient does not have insomnia.     As  per HPI. Otherwise, a complete review of systems is negative.  PAST MEDICAL HISTORY: Past Medical History:  Diagnosis Date  . Anxiety and depression   . Cancer ([place])   . Headache(784.0)   . High cholesterol   . HTN (hypertension)   . Hyperlipidemia   . Memory loss     PAST SURGICAL HISTORY: Past Surgical History:  Procedure Laterality Date  . COLONOSCOPY WITH PROPOFOL N/A 09/13/2015   Procedure: COLONOSCOPY WITH PROPOFOL;  Surgeon: Hulen Luster, MD;  Location: [facility] ENDOSCOPY;  Service: Gastroenterology;  Laterality: N/A;  . EUS N/A 09/30/2015   Procedure: LOWER ENDOSCOPIC ULTRASOUND (EUS);  Surgeon: Holly Bodily, MD;  Location: [facility] ENDOSCOPY;  Service: Gastroenterology;  Laterality: N/A;    FAMILY HISTORY Family History  Problem Relation Age of Onset  . Cancer    . Cancer Other        ADVANCED DIRECTIVES:    HEALTH MAINTENANCE: Social History  Substance Use Topics  . Smoking status: Never Smoker  . Smokeless tobacco: Never Used  . Alcohol use No    Allergies  Allergen Reactions  . Sertraline Other (See Comments)    Makes skin crawl  . Topiramate Other (See Comments)    Mania    Current Outpatient Prescriptions  Medication Sig Dispense Refill  . bisoprolol-hydrochlorothiazide (ZIAC) 2.5-6.25 MG per tablet Take 1 tablet by mouth daily.     Marland Kitchen escitalopram (LEXAPRO) 10 MG tablet Take 10 mg by mouth daily.  1  . fluticasone (FLONASE) 50 MCG/ACT nasal spray Place 2 sprays into both nostrils daily as needed for rhinitis.     Marland Kitchen KLOR-CON 10 10 MEQ tablet Take  10 mEq by mouth daily.  5  . omeprazole (PRILOSEC) 40 MG capsule Take 40 mg by mouth every evening.     . sildenafil (REVATIO) 20 MG tablet Take 1-5 prn    . sodium bicarbonate 650 MG tablet Take 1,300 mg by mouth every evening. Reported on 01/31/2016    . zonisamide (ZONEGRAN) 25 MG capsule Take 75 mg by mouth at bedtime.     . metoCLOPramide (REGLAN) 10 MG tablet Take 10 mg by mouth every 6 (six) hours as  needed for nausea. Reported on 01/06/2016    . polyethylene glycol (MIRALAX / GLYCOLAX) packet Take 17 g by mouth daily as needed.    . prochlorperazine (COMPAZINE) 10 MG tablet Take 10 mg by mouth every 6 (six) hours as needed. Reported on 01/06/2016     No current facility-administered medications for this visit.     OBJECTIVE: Vitals:   08/09/16 1050 08/09/16 1052  BP:  (!) 151/80  Pulse:  63  Resp: 18 18  Temp:  97.9 F (36.6 C)     Body mass index is 24.3 kg/m.    ECOG FS:0 - Asymptomatic  General: Well-developed, well-nourished, no acute distress. Eyes: Pink conjunctiva, anicteric sclera. Lungs: Clear to auscultation bilaterally. Heart: Regular rate and rhythm. No rubs, murmurs, or gallops. Abdomen: Soft, nontender, nondistended. No organomegaly noted, normoactive bowel sounds. Musculoskeletal: No edema, cyanosis, or clubbing. Neuro: Alert, answering all questions appropriately. Cranial nerves grossly intact. Skin: No rashes or petechiae noted. Psych: Anxious.   LAB RESULTS:  Lab Results  Component Value Date   NA 137 08/09/2016   K 3.8 08/09/2016   CL 104 08/09/2016   CO2 26 08/09/2016   GLUCOSE 151 (H) 08/09/2016   BUN 15 08/09/2016   CREATININE 1.26 (H) 08/09/2016   CALCIUM 9.1 08/09/2016   PROT 6.9 08/09/2016   ALBUMIN 4.2 08/09/2016   AST 24 08/09/2016   ALT 17 08/09/2016   ALKPHOS 69 08/09/2016   BILITOT 0.8 08/09/2016   GFRNONAA 57 (L) 08/09/2016   GFRAA >60 08/09/2016    Lab Results  Component Value Date   WBC 3.2 (L) 08/09/2016   NEUTROABS 2.3 08/09/2016   HGB 15.7 08/09/2016   HCT 44.6 08/09/2016   MCV 90.1 08/09/2016   PLT 141 (L) 08/09/2016     STUDIES: No results found.  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 No  further intervention is needed at this time. Patient was evaluated at [facility] in Sept. 2017 with direct visualization as well as manometry, which revealed weak internal and external anal sphincter pressure. Biofeedback recommended.  Patient reports no further issues with continence at this time. Per NCCN guidelines, patient will require an anoscope every 6-12 months for the next 2 years. Will also do abdominal and pelvic CT with contrast annually for the next 3 years. His next imaging with CT scan will be due in June 2018.  Return to clinic in 5 months with repeat laboratory work, further evaluation, and review of CT results.   2.Thrombocytopenia: Improved. Mild, continue to monitor. 3. Renal insufficiency: Creatinine mildly elevated today, monitor.    4. 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 Monitor. 5. Left shoulder pain: Continue Osteo-BiFlex. Contact PCP if not resolving. 6. Auditory/Visual changes: Contact  PCP for further evaluation.  Patient expressed understanding and was in agreement with this plan. He also understands that He can call clinic at any time with any questions, concerns, or complaints.    Lucendia Herrlich, NP  08/09/16 12:21 PM   Patient was seen and evaluated independently and I agree with the assessment and plan as dictated above.  Lloyd Huger, MD 08/11/16 1:18 PM

## 2016-08-09 ENCOUNTER — Inpatient Hospital Stay: Payer: Medicare Other | Attending: Oncology | Admitting: Oncology

## 2016-08-09 ENCOUNTER — Inpatient Hospital Stay: Payer: Medicare Other

## 2016-08-09 VITALS — BP 151/80 | HR 63 | Temp 97.9°F | Resp 18 | Wt 159.8 lb

## 2016-08-09 DIAGNOSIS — E7800 Pure hypercholesterolemia, unspecified: Secondary | ICD-10-CM | POA: Diagnosis not present

## 2016-08-09 DIAGNOSIS — Z79899 Other long term (current) drug therapy: Secondary | ICD-10-CM | POA: Insufficient documentation

## 2016-08-09 DIAGNOSIS — C210 Malignant neoplasm of anus, unspecified: Secondary | ICD-10-CM | POA: Diagnosis not present

## 2016-08-09 DIAGNOSIS — Z923 Personal history of irradiation: Secondary | ICD-10-CM

## 2016-08-09 DIAGNOSIS — N289 Disorder of kidney and ureter, unspecified: Secondary | ICD-10-CM | POA: Diagnosis not present

## 2016-08-09 DIAGNOSIS — F419 Anxiety disorder, unspecified: Secondary | ICD-10-CM | POA: Diagnosis not present

## 2016-08-09 DIAGNOSIS — D72819 Decreased white blood cell count, unspecified: Secondary | ICD-10-CM

## 2016-08-09 DIAGNOSIS — I10 Essential (primary) hypertension: Secondary | ICD-10-CM | POA: Diagnosis not present

## 2016-08-09 DIAGNOSIS — Z9221 Personal history of antineoplastic chemotherapy: Secondary | ICD-10-CM | POA: Diagnosis not present

## 2016-08-09 DIAGNOSIS — D696 Thrombocytopenia, unspecified: Secondary | ICD-10-CM | POA: Diagnosis not present

## 2016-08-09 DIAGNOSIS — M25512 Pain in left shoulder: Secondary | ICD-10-CM

## 2016-08-09 DIAGNOSIS — E785 Hyperlipidemia, unspecified: Secondary | ICD-10-CM | POA: Diagnosis not present

## 2016-08-09 LAB — CBC WITH DIFFERENTIAL/PLATELET
BASOS ABS: 0 10*3/uL (ref 0–0.1)
BASOS PCT: 1 %
EOS ABS: 0.1 10*3/uL (ref 0–0.7)
EOS PCT: 3 %
HCT: 44.6 % (ref 40.0–52.0)
HEMOGLOBIN: 15.7 g/dL (ref 13.0–18.0)
Lymphocytes Relative: 15 %
Lymphs Abs: 0.5 10*3/uL — ABNORMAL LOW (ref 1.0–3.6)
MCH: 31.7 pg (ref 26.0–34.0)
MCHC: 35.2 g/dL (ref 32.0–36.0)
MCV: 90.1 fL (ref 80.0–100.0)
Monocytes Absolute: 0.3 10*3/uL (ref 0.2–1.0)
Monocytes Relative: 9 %
NEUTROS PCT: 72 %
Neutro Abs: 2.3 10*3/uL (ref 1.4–6.5)
PLATELETS: 141 10*3/uL — AB (ref 150–440)
RBC: 4.95 MIL/uL (ref 4.40–5.90)
RDW: 13.1 % (ref 11.5–14.5)
WBC: 3.2 10*3/uL — AB (ref 3.8–10.6)

## 2016-08-09 LAB — COMPREHENSIVE METABOLIC PANEL
ALBUMIN: 4.2 g/dL (ref 3.5–5.0)
ALT: 17 U/L (ref 17–63)
AST: 24 U/L (ref 15–41)
Alkaline Phosphatase: 69 U/L (ref 38–126)
Anion gap: 7 (ref 5–15)
BUN: 15 mg/dL (ref 6–20)
CHLORIDE: 104 mmol/L (ref 101–111)
CO2: 26 mmol/L (ref 22–32)
CREATININE: 1.26 mg/dL — AB (ref 0.61–1.24)
Calcium: 9.1 mg/dL (ref 8.9–10.3)
GFR calc non Af Amer: 57 mL/min — ABNORMAL LOW (ref >60)
GLUCOSE: 151 mg/dL — AB (ref 65–99)
Potassium: 3.8 mmol/L (ref 3.5–5.1)
SODIUM: 137 mmol/L (ref 135–145)
Total Bilirubin: 0.8 mg/dL (ref 0.3–1.2)
Total Protein: 6.9 g/dL (ref 6.5–8.1)

## 2016-08-09 NOTE — Progress Notes (Signed)
Patient is here for follow up, he mentions some increased ringing in his ears. Notes slight vision changes out of his peripheral vision

## 2016-08-30 ENCOUNTER — Ambulatory Visit: Payer: Medicare Other | Attending: Gastroenterology | Admitting: Physical Therapy

## 2016-08-30 DIAGNOSIS — R2689 Other abnormalities of gait and mobility: Secondary | ICD-10-CM | POA: Diagnosis present

## 2016-08-30 DIAGNOSIS — R279 Unspecified lack of coordination: Secondary | ICD-10-CM | POA: Insufficient documentation

## 2016-08-30 DIAGNOSIS — M6281 Muscle weakness (generalized): Secondary | ICD-10-CM | POA: Insufficient documentation

## 2016-08-30 DIAGNOSIS — M25562 Pain in left knee: Secondary | ICD-10-CM | POA: Insufficient documentation

## 2016-08-30 NOTE — Patient Instructions (Signed)
  Proper body mechanics with getting out of a chair to decrease strain  on back &pelvic floor   Avoid holding your breath when Getting out of the chair:  Scoot to front part of chair chair Heels behind feet, feet are hip width apart, nose over toes  Inhale like you are smelling roses Exhale to stand     

## 2016-08-30 NOTE — Therapy (Signed)
[place] MAIN [facility] SERVICES [address] [place], [place], [address] Phone: [phone]   Fax:  [phone]  Physical Therapy Evaluation  Patient Details  Name: Brad Randall MRN: [id number] Date of Birth: 03-04-1946 Referring Provider: Laurine Randall  Encounter Date: 08/30/2016      PT End of Session - 08/30/16 2217    Number of Visits 12   Date for PT Re-Evaluation 11/23/16   Authorization Type g-code   PT Start Time 0810   PT Stop Time 0911   PT Time Calculation (min) 61 min   Activity Tolerance Patient tolerated treatment well;No increased pain   Behavior During Therapy WFL for tasks assessed/performed      Past Medical History:  Diagnosis Date  . Anxiety and depression   . Cancer ([place])   . Headache(784.0)   . High cholesterol   . HTN (hypertension)   . Hyperlipidemia   . Memory loss     Past Surgical History:  Procedure Laterality Date  . COLONOSCOPY WITH PROPOFOL N/A 09/13/2015   Procedure: COLONOSCOPY WITH PROPOFOL;  Surgeon: Brad Randall;  Location: [facility] ENDOSCOPY;  Service: Gastroenterology;  Laterality: N/A;  . EUS N/A 09/30/2015   Procedure: LOWER ENDOSCOPIC ULTRASOUND (EUS);  Surgeon: Brad Randall;  Location: [facility] ENDOSCOPY;  Service: Gastroenterology;  Laterality: N/A;    There were no vitals filed for this visit.       Subjective Assessment - 08/30/16 0854    Subjective 1) Pt reported he was Dx with anal cancer in early 2017. He underwent radiation and chemotherapy. Pt noticed a change in his bowels where he went from constipation to loose stools  during therapy.   A few months ago, pt learned to distinguish between flatuence and loose stool (Type 5 on Bristol Scale) .  Pt states he feels there is air in the anus. Pt goes to the toilet 3x / day  for bowel movement.  Surprise epsiodes of leakage occurs once every 2 weeks.  This impacts his ability to go to traveling on trips out of town and hiking and  with lifting clay. 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    2) urinary incontinence since the radiation/chemo therapy: dribbling occurs w/ standing during urination. Pt also noticed weaker stream in sitting and standing positions.   3) sexual function is become weaker since the CA Tx. Pt is taking Viagra.    4) L lateral knee pain: Pt had recent injury when getting up from the floor. Pain 2/10, at its worst 7-8/10. Pt had a previous injury when a refridgerator fell on him 8-9 years ago. Currently the knee is difficult to walk on. He states the ITB seems to be stretched and if it gets relocated, it feels better. This impacts his walking              [facility] PT Assessment - 08/30/16 0844      Assessment   Medical Diagnosis anal leakage   Referring Provider Brad Randall     Precautions   Precautions None     Restrictions   Weight Bearing Restrictions No     Balance Screen   Has the patient fallen in the past 6 months No     Prior Function   Level of Independence Independent     Observation/Other Assessments   Observations upright posture, simulated posture in pottery: forward lean, shoulder adduction/IR. chesting  in seated position    Other Surveys  --  COREFO      Sit to Stand   Comments breath holding     Floor to Stand   Comments able to demo quadriped position in downward facing dog      Posture/Postural Control   Posture Comments abdominal straining with cue for BM      Ambulation/Gait   Gait Pattern --  decreased L stance   Gait velocity 1.06 m/s                 Pelvic Floor Special Questions - 08/30/16 2216    External Perineal Exam deferred to next session   Pelvic Floor Internal Exam deferred to next session   Exam Type Rectal          [facility] Adult PT Treatment/Exercise - 08/30/16 0844      Therapeutic Activites    Therapeutic Activities --  see pt  instructions     Neuro Re-ed    Neuro Re-ed Details  see pt instructions                PT Education - 08/30/16 0944    Education provided Yes   Education Details POC, anatomy/physiology with images, goals, HEP   Person(s) Educated Patient   Methods Explanation;Demonstration;Tactile cues;Verbal cues;Handout   Comprehension Returned demonstration;Verbalized understanding             PT Long Term Goals - 08/30/16 2231      PT LONG TERM GOAL #1   Title Pt will decrease his COREFO score from  % to   < %  in order to show improved bowel function   Time 12   Period Weeks   Status New     PT LONG TERM GOAL #2   Title Pt will demo proper pelvic floor coordination with diaphragm and TrA mm in order to progress towards strengthening exercises   Time 12   Period Weeks   Status New     PT LONG TERM GOAL #3   Title Pt will demo proper contraction of pelvic floor mm in order to decrease leakage and promote blood flow to the perinal area   Time 12   Period Weeks   Status New     PT LONG TERM GOAL #4   Title Pt will demo proper alignment and propioception with fitness routines in order to minimze relapse of Sx   Time 12   Period Weeks   Status New     PT LONG TERM GOAL #5   Title Pt will demo less antlagic gait, increased stance time on L LE, and  increased gait speed from 1.06 m/s to > 1.2 m/s in order to show improved knee pain and to perform his ADLS   Time 12   Period Weeks   Status New               Plan - 08/30/16 2223    Clinical Impression Statement Pt is a 71 yo male who c/o pelvic floor dysfunctions 2/2 chemotherapy/radiation to treat anal CA one year ago.  These dysfunctions include fecal leakage, flatuence, post-urination dribble, weak urine stream,  weak erection and decreased ejaculation. Pt also c/o L knee pain with a recent reinjury from getting up from the floor. 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 PT plans to assess his knee and pelvic floor more thoroughly at future sessions. Following Tx today, pt demo'd proper sit to stand and floor to stand technique without strain on his pelvic floor nor knee.      Rehab Potential Good   Clinical Impairments Affecting Rehab Potential Hx of CA with chemo and radiation therapy   PT Frequency 1x / week   PT Duration 12 weeks   PT Treatment/Interventions ADLs/Self Care Home Management;Aquatic Therapy;Electrical Stimulation;Moist Heat;Traction;Gait training;Stair training;Functional mobility training;Therapeutic activities;Therapeutic exercise;Balance training;Patient/family education;Neuromuscular re-education;Manual techniques;Manual lymph drainage;Scar mobilization;Taping;Energy conservation;Passive range of motion   Consulted and Agree with Plan of Care Patient      Patient will benefit from skilled therapeutic intervention in order to improve the following deficits and impairments:  Abnormal gait, Pain, Decreased mobility, Decreased coordination, Improper body mechanics, Decreased strength, Decreased endurance, Decreased activity tolerance, Decreased safety awareness, Difficulty walking, Hypomobility, Impaired flexibility, Decreased scar mobility  Visit Diagnosis: Unspecified lack of coordination  Other abnormalities of gait and mobility  Muscle weakness (generalized)  Left knee pain, unspecified chronicity      G-Codes - 09-29-2016 11/27/2236    Functional Assessment Tool Used clinical judgement, COREFO, gait speed   Functional Limitation Self care;Mobility: Walking and moving around   Mobility: Walking and Moving Around Current Status [phone]) At least 20 percent but less than 40 percent impaired, limited or restricted   Mobility: Walking and Moving Around Goal  Status [phone]) At least 1 percent but less than 20 percent impaired, limited or restricted   Self Care Current Status [id number]) At least 40 percent but less than 60 percent impaired, limited or restricted   Self Care Goal Status [id number]) At least 20 percent but less than 40 percent impaired, limited or restricted       Problem List Patient Active Problem List   Diagnosis Date Noted  . Anal cancer ([place]) 10/06/2015  . Mood disorder ([place]) 06/14/2015  . Memory loss 12/03/2012  . 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 Unspecified essential hypertension 12/03/2012  . Other and unspecified hyperlipidemia 12/03/2012  . HA (headache) 02/19/2012    Jerl Mina ,PT, DPT, E-RYT  2016/09/29, 10:38 PM  [place] MAIN [facility] SERVICES [address] [place], [place], [address] Phone: [phone]   Fax:  [phone]  Name: KHYRAN ENGBERG MRN: [id number] Date of Birth: 1945/11/29

## 2016-09-06 ENCOUNTER — Ambulatory Visit: Payer: Medicare Other | Admitting: Physical Therapy

## 2016-09-11 ENCOUNTER — Ambulatory Visit: Payer: Medicare Other | Attending: Gastroenterology | Admitting: Physical Therapy

## 2016-09-11 DIAGNOSIS — R2689 Other abnormalities of gait and mobility: Secondary | ICD-10-CM | POA: Diagnosis present

## 2016-09-11 DIAGNOSIS — M6281 Muscle weakness (generalized): Secondary | ICD-10-CM | POA: Insufficient documentation

## 2016-09-11 DIAGNOSIS — R279 Unspecified lack of coordination: Secondary | ICD-10-CM | POA: Diagnosis present

## 2016-09-11 DIAGNOSIS — M25562 Pain in left knee: Secondary | ICD-10-CM | POA: Diagnosis present

## 2016-09-11 NOTE — Patient Instructions (Addendum)
Pelvic floor coordination   Pelvic floor lengthening with inhalation   (two hammocks)   Exhalation: focus on lifting perineum lift urethra and anus simultaneously  (two jellyfish)   10 reps   Practice in standing, sitting (find pelvic neutral) , and sidelying    _______  .PELVIC FLOOR / KEGEL EXERCISES   Pelvic floor/ Kegel exercises are used to strengthen the muscles in the base of your pelvis that are responsible for supporting your pelvic organs and preventing urine/feces leakage. Based on your therapist's recommendations, they can be performed while standing, sitting, or lying down.  Make yourself aware of this muscle group by using these cues:  Imagine you are in a crowded room and you feel the need to pass gas. Your response is to pull up and in at the rectum.  Close the rectum. Pull the muscles up inside your body,feeling your scrotum lifting as well . Feel the pelvic floor muscles lift as if you were walking into a cold lake.  Place your hand on top of your pubic bone. Tighten and draw in the muscles around the anal muscles without squeezing the buttock muscles.  Common Errors:  Breath holding: If you are holding your breath, you may be bearing down against your bladder instead of pulling it up. If you belly bulges up while you are squeezing, you are holding your breath. Be sure to breathe gently in and out while exercising. Counting out loud may help you avoid holding your breath.  Accessory muscle use: You should not see or feel other muscle movement when performing pelvic floor exercises. When done properly, no one can tell that you are performing the exercises. Keep the buttocks, belly and inner thighs relaxed.  Overdoing it: Your muscles can fatigue and stop working for you if you over-exercise. You may actually leak more or feel soreness at the lower abdomen or rectum.  YOUR HOME EXERCISE PROGRAM    SHORT HOLDS: Position: on back, sitting , standing (minisquat,  inhale to length, standing up, exhale to strengthen)   Inhale and then exhale. Then squeeze the muscle. Make sure to lengthen pelvic floor back down before squeezing again  (Be sure to let belly sink in with exhales and not push outward)  Perform 5 repetitions, 5  Times/day  **ALSO SQUEEZE BEFORE YOUR SNEEZE, COUGH, LAUGH to decrease downward pressure   **ALSO EXHALE BEFORE YOU RISE AGAINST GRAVITY (lifting, sit to stand, from squat to stand)    ___________

## 2016-09-11 NOTE — Therapy (Signed)
[place] MAIN [facility] SERVICES [address] [place], [place], [address] Phone: [phone]   Fax:  [phone]  Physical Therapy Treatment  Patient Details  Name: Brad Randall MRN: [id number] Date of Birth: August 14, 1945 Referring Provider: Laurine Blazer  Encounter Date: 09/11/2016      PT End of Session - 09/11/16 1610    Visit Number 2   Number of Visits 12   Date for PT Re-Evaluation 11/23/16   Authorization Type g-code   PT Start Time 1608   PT Stop Time 1655   PT Time Calculation (min) 47 min   Activity Tolerance Patient tolerated treatment well;No increased pain   Behavior During Therapy WFL for tasks assessed/performed      Past Medical History:  Diagnosis Date  . Anxiety and depression   . Cancer ([place])   . Headache(784.0)   . High cholesterol   . HTN (hypertension)   . Hyperlipidemia   . Memory loss     Past Surgical History:  Procedure Laterality Date  . COLONOSCOPY WITH PROPOFOL N/A 09/13/2015   Procedure: COLONOSCOPY WITH PROPOFOL;  Surgeon: Hulen Luster, MD;  Location: [facility] ENDOSCOPY;  Service: Gastroenterology;  Laterality: N/A;  . EUS N/A 09/30/2015   Procedure: LOWER ENDOSCOPIC ULTRASOUND (EUS);  Surgeon: Holly Bodily, MD;  Location: [facility] ENDOSCOPY;  Service: Gastroenterology;  Laterality: N/A;    There were no vitals filed for this visit.      Subjective Assessment - 09/11/16 1611    Subjective Pt's L knee knee has been better with chiropractic Tx and minisquat exercise from last session. Pt finds the breathing technique to be helpful in the pelvic floor. Pt was Dx with bronchitis yesterday at Urgent Care. This medications have been helping him.                         Pelvic Floor Special Questions - 09/11/16 1634    External Perineal Exam observed contraction with cue for inhalation (able to reverse with training)     Skin Integrity Intact;Other   Skin Integrity other skin tone diference  in ~ 1-2 inches from anus, skin texture more smooth / shiny (radiated area) but no redness            [facility] Adult PT Treatment/Exercise - 09/11/16 1638      Neuro Re-ed    Neuro Re-ed Details  cuing for pelvic floor expansion with inhalation  5 reps in sidelying, seated, standing 5 quick contractions.  seated with focus on perineum during exhalation.  Hand gestures to elicit less confusion                PT Education - 09/11/16 1644    Education provided Yes   Education Details HEP   Person(s) Educated Patient   Methods Explanation;Demonstration;Tactile cues;Verbal cues;Handout   Comprehension Returned demonstration;Verbalized understanding             PT Long Term Goals - 08/30/16 2231      PT LONG TERM GOAL #1   Title Pt will decrease his COREFO score from  % to   < %  in order to show improved bowel function   Time 12   Period Weeks   Status New     PT LONG TERM GOAL #2   Title Pt will demo proper pelvic floor coordination with diaphragm and TrA mm in order to progress towards strengthening exercises   Time 12  Period Weeks   Status New     PT LONG TERM GOAL #3   Title Pt will demo proper contraction of pelvic floor mm in order to decrease leakage and promote blood flow to the perinal area   Time 12   Period Weeks   Status New     PT LONG TERM GOAL #4   Title Pt will demo proper alignment and propioception with fitness routines in order to minimze relapse of Sx   Time 12   Period Weeks   Status New     PT LONG TERM GOAL #5   Title Pt will demo less antlagic gait, increased stance time on L LE, and  increased gait speed from 1.06 m/s to > 1.2 m/s in order to show improved knee pain and to perform his ADLS   Time 12   Period Weeks   Status New               Plan - 09/11/16 1645    Clinical Impression Statement Pt demo'd dyscoordination of pelvic floor muscles with external observation. Pt was able to demo proper coodination following  verbal and tactile cues at coccygeus mm. Deferred internal exam because pt had a respiration infection.  Pt demo'd ability to perform proper pelvic floor coordination with quick contraction in sidelying, sitting, and standing positions. Pt continues to benefit with skilled PT.     Rehab Potential Good   Clinical Impairments Affecting Rehab Potential Hx of CA with chemo and radiation therapy   PT Frequency 1x / week   PT Duration 12 weeks   PT Treatment/Interventions ADLs/Self Care Home Management;Aquatic Therapy;Electrical Stimulation;Moist Heat;Traction;Gait training;Stair training;Functional mobility training;Therapeutic activities;Therapeutic exercise;Balance training;Patient/family education;Neuromuscular re-education;Manual techniques;Manual lymph drainage;Scar mobilization;Taping;Energy conservation;Passive range of motion   Consulted and Agree with Plan of Care Patient      Patient will benefit from skilled therapeutic intervention in order to improve the following deficits and impairments:  Abnormal gait, Pain, Decreased mobility, Decreased coordination, Improper body mechanics, Decreased strength, Decreased endurance, Decreased activity tolerance, Decreased safety awareness, Difficulty walking, Hypomobility, Impaired flexibility, Decreased scar mobility  Visit Diagnosis: Unspecified lack of coordination  Other abnormalities of gait and mobility  Muscle weakness (generalized)  Left knee pain, unspecified chronicity     Problem List Patient Active Problem List   Diagnosis Date Noted  . Anal cancer ([place]) 10/06/2015  . Mood disorder ([place]) 06/14/2015  . Memory loss 12/03/2012  . 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 Unspecified essential hypertension 12/03/2012  . Other and unspecified hyperlipidemia 12/03/2012  . HA (headache) 02/19/2012    Jerl Mina ,PT, DPT, E-RYT  09/11/2016, 4:52 PM  [place] MAIN [facility] SERVICES [address]  [place], [place], [address] Phone: [phone]   Fax:  [phone]  Name: Brad Randall MRN: [id number] Date of Birth: 04/10/1946

## 2016-09-14 ENCOUNTER — Other Ambulatory Visit: Payer: Self-pay | Admitting: Student

## 2016-09-14 DIAGNOSIS — S83422A Sprain of lateral collateral ligament of left knee, initial encounter: Secondary | ICD-10-CM

## 2016-09-19 ENCOUNTER — Ambulatory Visit: Payer: Medicare Other | Admitting: Physical Therapy

## 2016-09-19 DIAGNOSIS — M6281 Muscle weakness (generalized): Secondary | ICD-10-CM

## 2016-09-19 DIAGNOSIS — R2689 Other abnormalities of gait and mobility: Secondary | ICD-10-CM

## 2016-09-19 DIAGNOSIS — M25562 Pain in left knee: Secondary | ICD-10-CM

## 2016-09-19 DIAGNOSIS — R279 Unspecified lack of coordination: Secondary | ICD-10-CM | POA: Diagnosis not present

## 2016-09-19 NOTE — Patient Instructions (Addendum)
Deep core level 2 (handout)   4 Standing PNF band exercises :    "Drawing a sword" " Pulling a lawnmover"  Band is over the edge of the door, knot is on the other side of the door,  R hand holds end of the band by L ear level   Press downward into the ballmounds and heels of the feet, thigh muscle active, Keep knees and hips squared the front and do not move them throughout the activity, Exhale to pull band across the face to the R pocket, rotating only your ribcage    10 x 2 reps    "Pulling seat belt"   Band over door knob with knot on the other side of the door Stand perpendicular to the door,  Exhale to pull band from R ear height across body to the L pocket without turning pelvis and knees  Follow hand with eyes/head  10 x 2 reps     Multifidis twist  Band is on doorknob: stand further away from door (facing perpendicular)   Twisting trunk without moving the hips and knees Hold band at between the ear and shoulder height   Exhale twist,.10-15 deg away from door without moving your hips/ knees. Continue to maintain equal weight through legs. Keep knee unlocked.  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

## 2016-09-19 NOTE — Therapy (Signed)
[place] MAIN [facility] SERVICES [address] [place], [place], [address] Phone: [phone]   Fax:  [phone]  Physical Therapy Treatment  Patient Details  Name: Brad Randall MRN: [id number] Date of Birth: Nov 11, 1945 Referring Provider: Laurine Blazer  Encounter Date: 09/19/2016      PT End of Session - 09/19/16 2251    Visit Number 3   Number of Visits 12   Date for PT Re-Evaluation 11/23/16   Authorization Type g-code   PT Start Time 0915   PT Stop Time 1010   PT Time Calculation (min) 55 min   Activity Tolerance Patient tolerated treatment well;No increased pain   Behavior During Therapy WFL for tasks assessed/performed      Past Medical History:  Diagnosis Date  . Anxiety and depression   . Cancer ([place])   . Headache(784.0)   . High cholesterol   . HTN (hypertension)   . Hyperlipidemia   . Memory loss     Past Surgical History:  Procedure Laterality Date  . COLONOSCOPY WITH PROPOFOL N/A 09/13/2015   Procedure: COLONOSCOPY WITH PROPOFOL;  Surgeon: Hulen Luster, MD;  Location: [facility] ENDOSCOPY;  Service: Gastroenterology;  Laterality: N/A;  . EUS N/A 09/30/2015   Procedure: LOWER ENDOSCOPIC ULTRASOUND (EUS);  Surgeon: Holly Bodily, MD;  Location: [facility] ENDOSCOPY;  Service: Gastroenterology;  Laterality: N/A;    There were no vitals filed for this visit.      Subjective Assessment - 09/19/16 0921    Subjective Pt reported he has not had fecal leakage since last session. His surprise leakage episodes and flatulence have become "rare". Pt has noticed when he uses the restroom, he coordinates his breathing to relax which helps with eliminate of bowel and urine.  Pt feels he is able to urinate standing while in public places and is completely emptying all the way. This was difficult to do in the past.             [facility] PT Assessment - 09/19/16 0001      Other:   Other/ Comments poor alignment with pelvis/knees in fitness  exercises, difficult to dissassociate trunk from pelvis                      [facility] Adult PT Treatment/Exercise - 09/19/16 2249      Therapeutic Activites    Therapeutic Activities --  see pt instructions     Neuro Re-ed    Neuro Re-ed Details  see pt instructions                PT Education - 09/19/16 2251    Education provided Yes   Education Details HEP   Person(s) Educated Patient   Methods Explanation;Demonstration;Tactile cues;Verbal cues;Handout   Comprehension Returned demonstration;Verbalized understanding             PT Long Term Goals - 09/19/16 0924      PT LONG TERM GOAL #1   Title Pt will decrease his COREFO score from  % to   < %  in order to show improved bowel function   Time 12   Period Weeks   Status On-going     PT LONG TERM GOAL #2   Title Pt will demo proper pelvic floor coordination with diaphragm and TrA mm in order to progress towards strengthening exercises   Time 12   Period Weeks   Status Achieved     PT LONG TERM GOAL #3  Title Pt will demo proper contraction of pelvic floor mm in order to decrease leakage and promote blood flow to the perinal area   Time 12   Period Weeks   Status Achieved     PT LONG TERM GOAL #4   Title Pt will demo proper alignment and propioception with fitness routines in order to minimze relapse of Sx   Time 12   Period Weeks   Status On-going     PT LONG TERM GOAL #5   Title Pt will demo less antlagic gait, increased stance time on L LE, and  increased gait speed from 1.06 m/s to > 1.2 m/s in order to show improved knee pain and to perform his ADLS   Time 12   Period Weeks   Status On-going               Plan - 09/19/16 [id number]    Clinical Impression Statement Pt is progressing well towards his goals with report of significantly less fecal leakage and improved complete emptying w/ urination. 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 Advanced pt with deep core and EO/IO/ multifidis strengthening exercises which he demo'd correctly. Pt continues to benefit from skilled PT.   Rehab Potential Good   Clinical Impairments Affecting Rehab Potential Hx of CA with chemo and radiation therapy   PT Frequency 1x / week   PT Duration 12 weeks   PT Treatment/Interventions ADLs/Self Care Home Management;Aquatic Therapy;Electrical Stimulation;Moist Heat;Traction;Gait training;Stair training;Functional mobility training;Therapeutic activities;Therapeutic exercise;Balance training;Patient/family education;Neuromuscular re-education;Manual techniques;Manual lymph drainage;Scar mobilization;Taping;Energy conservation;Passive range of motion   Consulted and Agree with Plan of Care Patient      Patient will benefit from skilled therapeutic intervention in order to improve the following deficits and impairments:  Abnormal gait, Pain, Decreased mobility, Decreased coordination, Improper body mechanics, Decreased strength, Decreased endurance, Decreased activity tolerance, Decreased safety awareness, Difficulty walking, Hypomobility, Impaired flexibility, Decreased scar mobility  Visit Diagnosis: Unspecified lack of coordination  Other abnormalities of gait and mobility  Muscle weakness (generalized)  Left knee pain, unspecified chronicity     Problem List Patient Active Problem List   Diagnosis Date Noted  . Anal cancer ([place]) 10/06/2015  . Mood disorder ([place]) 06/14/2015  . Memory loss 12/03/2012  . 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 Unspecified essential hypertension 12/03/2012  . Other and unspecified hyperlipidemia 12/03/2012  . HA (headache) 02/19/2012    Jerl Mina ,PT, DPT, E-RYT  09/19/2016, 10:53 PM  [place] MAIN [facility] SERVICES [address] [place], [place], [address] Phone: [phone]   Fax:  [phone]  Name: Brad Randall MRN: [id number] Date of Birth: 05-26-1946

## 2016-09-25 ENCOUNTER — Ambulatory Visit
Admission: RE | Admit: 2016-09-25 | Discharge: 2016-09-25 | Disposition: A | Discharge status: Home / Self Care | Payer: Medicare Other | Source: Ambulatory Visit | Attending: Student | Admitting: Student

## 2016-09-25 ENCOUNTER — Ambulatory Visit: Payer: Medicare Other | Admitting: Physical Therapy

## 2016-09-25 DIAGNOSIS — S83422A Sprain of lateral collateral ligament of left knee, initial encounter: Secondary | ICD-10-CM | POA: Diagnosis present

## 2016-09-25 DIAGNOSIS — R279 Unspecified lack of coordination: Secondary | ICD-10-CM | POA: Diagnosis not present

## 2016-09-25 DIAGNOSIS — M23262 Derangement of other lateral meniscus due to old tear or injury, left knee: Secondary | ICD-10-CM | POA: Diagnosis not present

## 2016-09-25 DIAGNOSIS — M7642 Tibial collateral bursitis [Pellegrini-Stieda], left leg: Secondary | ICD-10-CM | POA: Diagnosis not present

## 2016-09-25 DIAGNOSIS — M6281 Muscle weakness (generalized): Secondary | ICD-10-CM

## 2016-09-25 DIAGNOSIS — M25462 Effusion, left knee: Secondary | ICD-10-CM | POA: Diagnosis not present

## 2016-09-25 DIAGNOSIS — M23222 Derangement of posterior horn of medial meniscus due to old tear or injury, left knee: Secondary | ICD-10-CM | POA: Insufficient documentation

## 2016-09-25 DIAGNOSIS — M25562 Pain in left knee: Secondary | ICD-10-CM

## 2016-09-25 DIAGNOSIS — R2689 Other abnormalities of gait and mobility: Secondary | ICD-10-CM

## 2016-09-25 NOTE — Patient Instructions (Addendum)
Refining the multifidis twist exercise  _ inhale, fully lengthen pelvic floor muscles before pulling band on exhale  _ do not move the knees/ pelvis with the twist    Refining squat: _not so low _ exhale with pelvic floor squeeze    _____________  Bridging series w/ resistive band other side of doorknob:  Level 1:  Position:  Elbows bent, knees hip width apart, heels on chair or stool/  Stabilization points: shoulders, upper arms, back of head pressed into floor. Heel press downward.   Movement: inhale do nothing, exhale pull band by side, lower fists to floor completely while lifting hips.Keep stabilization points engaged when you allow the band to go back to starting position  10 x 2 reps     Level 2:  Position:  Elbows straight, arms raised to ceiling at shoulder height, knees apart like a ballerina,heels together on chair or stool   Stabilization points: shoulders, upper arms, back of head pressed into floor. Heel press downward.   Movement: inhale do nothing, exhale pull band by side, lower fists to floor completely while lifting hips. Keep stabilization points engaged when you allow the band to go back to starting position   10 x 2 reps    __________  PELVIC FLOOR / KEGEL EXERCISES   Pelvic floor/ Kegel exercises are used to strengthen the muscles in the base of your pelvis that are responsible for supporting your pelvic organs and preventing urine/feces leakage. Based on your therapist's recommendations, they can be performed while standing, sitting, or lying down.  Make yourself aware of this muscle group by using these cues:  Imagine you are in a crowded room and you feel the need to pass gas. Your response is to pull up and in at the rectum.  Close the rectum. Pull the muscles up inside your body,feeling your scrotum lifting as well . Feel the pelvic floor muscles lift as if you were walking into a cold lake.  Place your hand on top of your pubic bone.  Tighten and draw in the muscles around the anal muscles without squeezing the buttock muscles.  Common Errors:  Breath holding: If you are holding your breath, you may be bearing down against your bladder instead of pulling it up. If you belly bulges up while you are squeezing, you are holding your breath. Be sure to breathe gently in and out while exercising. Counting out loud may help you avoid holding your breath.  Accessory muscle use: You should not see or feel other muscle movement when performing pelvic floor exercises. When done properly, no one can tell that you are performing the exercises. Keep the buttocks, belly and inner thighs relaxed.  Overdoing it: Your muscles can fatigue and stop working for you if you over-exercise. You may actually leak more or feel soreness at the lower abdomen or rectum.  YOUR HOME EXERCISE PROGRAM  LONG HOLDS: Position: on back  Inhale and then exhale. Then squeeze the muscle and count aloud for 5 seconds. Rest with three long breaths. (Be sure to let belly sink in with exhales and not push outward)  Perform 5 repetitions, 3 times/day   Continue to perform the quick ones throughout the day    __________ Return to walking. 1 x/ this week.  With 0.5  mile in am, 0.5  mile in pm.   (one time this week and compare with 0.5 mi seated biking x 2 to see which one causes less L knee pain)  Which  ever activity caused less pain, try to incorporate 3x week.    Withhold gym routine except for the aerobic above.   _______

## 2016-09-25 NOTE — Therapy (Signed)
[place] MAIN [facility] SERVICES [address] [place], [place], [address] Phone: [phone]   Fax:  [phone]  Physical Therapy Treatment  Patient Details  Name: Brad Randall MRN: [id number] Date of Birth: [birth year] Referring Provider: Laurine Blazer  Encounter Date: 09/25/2016      PT End of Session - 09/25/16 2000    Visit Number 4   Number of Visits 12   Date for PT Re-Evaluation 11/23/16   Authorization Type g-code   PT Start Time 1005   PT Stop Time 1100   PT Time Calculation (min) 55 min   Activity Tolerance Patient tolerated treatment well;No increased pain   Behavior During Therapy WFL for tasks assessed/performed      Past Medical History:  Diagnosis Date  . Anxiety and depression   . Cancer ([place])   . Headache(784.0)   . High cholesterol   . HTN (hypertension)   . Hyperlipidemia   . Memory loss     Past Surgical History:  Procedure Laterality Date  . COLONOSCOPY WITH PROPOFOL N/A 09/13/2015   Procedure: COLONOSCOPY WITH PROPOFOL;  Surgeon: Hulen Luster, MD;  Location: [facility] ENDOSCOPY;  Service: Gastroenterology;  Laterality: N/A;  . EUS N/A 09/30/2015   Procedure: LOWER ENDOSCOPIC ULTRASOUND (EUS);  Surgeon: Holly Bodily, MD;  Location: [facility] ENDOSCOPY;  Service: Gastroenterology;  Laterality: N/A;    There were no vitals filed for this visit.      Subjective Assessment - 09/25/16 1010    Subjective Pt will be getting an MRI on his L knee. Pt has been having noticing warmth and pain in the L lateral aspect of his knee after performing his mini squats from his HEP. Pt reported a tiny trickle of brown liquid the size of a fingernail when standing at church. Pt did not have sensation of the leakage.  Typcially he notices stool leakage but this time was only liquid. Pt has not had an surprise stool leakage for the past month which is an improvement from once every 2 weeks.             [facility] PT Assessment -  09/25/16 1039      Squat   Comments low squat with L knee pain, cued for mini squat and had no Ll knee pain     Other:   Other/ Comments reviewed form with multifidis twist. Showed poor L knee stability and lack of dissassociation of lower kinetic chain with trunk and no pelvic floor lengthening phase.       Palpation   Palpation comment pelvic floor contraction with resistance band (tactile )  External palpation through clothes: 5 sec, 5 reps                      [facility] Adult PT Treatment/Exercise - 09/25/16 1041      Therapeutic Activites    Therapeutic Activities --  see pt instructions     Neuro Re-ed    Neuro Re-ed Details  see pt instructions                PT Education - 09/25/16 1050    Education provided Yes   Education Details HEP   Person(s) Educated Patient   Methods Explanation;Demonstration;Tactile cues;Verbal cues;Handout   Comprehension Returned demonstration;Verbalized understanding;Verbal cues required;Tactile cues required             PT Long Term Goals - 09/25/16 1017      PT LONG TERM  GOAL #1   Title Pt will decrease his COREFO score from  % to   < %  in order to show improved bowel function   Time 12   Period Weeks   Status On-going     PT LONG TERM GOAL #2   Title Pt will demo proper pelvic floor coordination with diaphragm and TrA mm in order to progress towards strengthening exercises   Time 12   Period Weeks   Status Achieved     PT LONG TERM GOAL #3   Title Pt will demo proper contraction of pelvic floor mm in order to decrease leakage and promote blood flow to the perinal area   Time 12   Period Weeks   Status Achieved     PT LONG TERM GOAL #4   Title Pt will demo proper alignment and propioception with fitness routines in order to minimze relapse of Sx   Time 12   Period Weeks   Status On-going     PT LONG TERM GOAL #5   Title Pt will demo less antlagic gait, increased stance time on L LE, and  increased  gait speed from 1.06 m/s to > 1.2 m/s in order to show improved knee pain and to perform his ADLS   Time 12   Period Weeks   Status On-going               Plan - 09/25/16 1016    Clinical Impression Statement Pt has not had an surprise stool leakage for the past month which is an improvement from once every 2 weeks. Pt's remaining liquid leakage issue with progression to endurance pelvic floor training. Refined propioception in HEP to minimize L knee pain. Pt will be getting an MRI on his L knee today and PT plans to work more on his L knee at future sessions with input from his MRI report.  New HEP did not cause L knee pain. Pt is progressing well with continued PT.    Rehab Potential Good   Clinical Impairments Affecting Rehab Potential Hx of CA with chemo and radiation therapy   PT Frequency 1x / week   PT Duration 12 weeks   PT Treatment/Interventions ADLs/Self Care Home Management;Aquatic Therapy;Electrical Stimulation;Moist Heat;Traction;Gait training;Stair training;Functional mobility training;Therapeutic activities;Therapeutic exercise;Balance training;Patient/family education;Neuromuscular re-education;Manual techniques;Manual lymph drainage;Scar mobilization;Taping;Energy conservation;Passive range of motion   Consulted and Agree with Plan of Care Patient      Patient will benefit from skilled therapeutic intervention in order to improve the following deficits and impairments:  Abnormal gait, Pain, Decreased mobility, Decreased coordination, Improper body mechanics, Decreased strength, Decreased endurance, Decreased activity tolerance, Decreased safety awareness, Difficulty walking, Hypomobility, Impaired flexibility, Decreased scar mobility  Visit Diagnosis: Unspecified lack of coordination  Other abnormalities of gait and mobility  Muscle weakness (generalized)  Left knee pain, unspecified chronicity     Problem List Patient Active Problem List   Diagnosis Date  Noted  . Anal cancer ([place]) 10/06/2015  . Mood disorder ([place]) 06/14/2015  . Memory loss 12/03/2012  . 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 Unspecified essential hypertension 12/03/2012  . Other and unspecified hyperlipidemia 12/03/2012  . HA (headache) 02/19/2012    Jerl Mina ,PT, DPT, E-RYT  09/25/2016, 8:02 PM  [place] MAIN [facility] SERVICES [address] [place], [place], [address] Phone: [phone]   Fax:  [phone]  Name: CHAD HOERIG MRN: [id number] Date of Birth: September 03, 1945

## 2016-10-02 ENCOUNTER — Ambulatory Visit
Admission: RE | Admit: 2016-10-02 | Discharge: 2016-10-02 | Disposition: A | Discharge status: Home / Self Care | Payer: Medicare Other | Source: Ambulatory Visit | Attending: Surgery | Admitting: Surgery

## 2016-10-02 ENCOUNTER — Encounter
Admission: RE | Disposition: A | Discharge status: Home / Self Care | Payer: Self-pay | Source: Ambulatory Visit | Attending: Surgery

## 2016-10-02 ENCOUNTER — Encounter: Payer: Self-pay | Admitting: *Deleted

## 2016-10-02 ENCOUNTER — Ambulatory Visit: Payer: Medicare Other | Admitting: Anesthesiology

## 2016-10-02 ENCOUNTER — Ambulatory Visit: Payer: Medicare Other | Admitting: Physical Therapy

## 2016-10-02 DIAGNOSIS — I10 Essential (primary) hypertension: Secondary | ICD-10-CM | POA: Insufficient documentation

## 2016-10-02 DIAGNOSIS — X58XXXA Exposure to other specified factors, initial encounter: Secondary | ICD-10-CM | POA: Diagnosis not present

## 2016-10-02 DIAGNOSIS — F329 Major depressive disorder, single episode, unspecified: Secondary | ICD-10-CM | POA: Diagnosis not present

## 2016-10-02 DIAGNOSIS — S83252A Bucket-handle tear of lateral meniscus, current injury, left knee, initial encounter: Secondary | ICD-10-CM | POA: Insufficient documentation

## 2016-10-02 DIAGNOSIS — Z79899 Other long term (current) drug therapy: Secondary | ICD-10-CM | POA: Diagnosis not present

## 2016-10-02 DIAGNOSIS — M25562 Pain in left knee: Secondary | ICD-10-CM | POA: Diagnosis present

## 2016-10-02 DIAGNOSIS — K219 Gastro-esophageal reflux disease without esophagitis: Secondary | ICD-10-CM | POA: Diagnosis not present

## 2016-10-02 DIAGNOSIS — S83232A Complex tear of medial meniscus, current injury, left knee, initial encounter: Secondary | ICD-10-CM | POA: Diagnosis not present

## 2016-10-02 HISTORY — PX: KNEE ARTHROSCOPY WITH MENISCAL REPAIR: SHX5653

## 2016-10-02 SURGERY — ARTHROSCOPY, KNEE, WITH MENISCUS REPAIR
Anesthesia: General | Site: Knee | Laterality: Left | Wound class: Clean

## 2016-10-02 MED ORDER — HYDROCODONE-ACETAMINOPHEN 5-325 MG PO TABS: 1.0000 | ORAL_TABLET | Freq: Four times a day (QID) | ORAL | 0 refills | Status: AC | PRN

## 2016-10-02 MED ORDER — KETOROLAC TROMETHAMINE 30 MG/ML IJ SOLN
INTRAMUSCULAR | Status: AC
  Filled 2016-10-02: qty 1

## 2016-10-02 MED ORDER — MIDAZOLAM HCL 2 MG/2ML IJ SOLN
INTRAMUSCULAR | Status: AC
  Filled 2016-10-02: qty 2

## 2016-10-02 MED ORDER — OXYCODONE HCL 5 MG PO TABS: 5.0000 mg | ORAL_TABLET | Freq: Once | ORAL | Status: DC | PRN

## 2016-10-02 MED ORDER — LIDOCAINE HCL (PF) 1 % IJ SOLN
INTRAMUSCULAR | Status: AC
  Filled 2016-10-02: qty 30

## 2016-10-02 MED ORDER — CEFAZOLIN SODIUM-DEXTROSE 2-4 GM/100ML-% IV SOLN
2.0000 g | Freq: Once | INTRAVENOUS | Status: AC
  Administered 2016-10-02: 2 g via INTRAVENOUS

## 2016-10-02 MED ORDER — EPHEDRINE SULFATE 50 MG/ML IJ SOLN
INTRAMUSCULAR | Status: DC | PRN
  Administered 2016-10-02: 10 mg via INTRAVENOUS

## 2016-10-02 MED ORDER — OXYCODONE HCL 5 MG/5ML PO SOLN: 5.0000 mg | Freq: Once | ORAL | Status: DC | PRN

## 2016-10-02 MED ORDER — LIDOCAINE HCL (PF) 2 % IJ SOLN
INTRAMUSCULAR | Status: AC
  Filled 2016-10-02: qty 2

## 2016-10-02 MED ORDER — BUPIVACAINE-EPINEPHRINE (PF) 0.5% -1:200000 IJ SOLN
INTRAMUSCULAR | Status: DC | PRN
  Administered 2016-10-02 (×2): 30 mL via PERINEURAL

## 2016-10-02 MED ORDER — PROPOFOL 10 MG/ML IV BOLUS
INTRAVENOUS | Status: AC
  Filled 2016-10-02: qty 20

## 2016-10-02 MED ORDER — BUPIVACAINE-EPINEPHRINE (PF) 0.5% -1:200000 IJ SOLN
INTRAMUSCULAR | Status: AC
  Filled 2016-10-02: qty 60

## 2016-10-02 MED ORDER — LIDOCAINE HCL 1 % IJ SOLN
INTRAMUSCULAR | Status: DC | PRN
  Administered 2016-10-02: 30 mL

## 2016-10-02 MED ORDER — DEXAMETHASONE SODIUM PHOSPHATE 10 MG/ML IJ SOLN
INTRAMUSCULAR | Status: AC
  Filled 2016-10-02: qty 1

## 2016-10-02 MED ORDER — KETOROLAC TROMETHAMINE 30 MG/ML IJ SOLN
INTRAMUSCULAR | Status: DC | PRN
  Administered 2016-10-02: 15 mg via INTRAVENOUS

## 2016-10-02 MED ORDER — LACTATED RINGERS IV SOLN
INTRAVENOUS | Status: DC
  Administered 2016-10-02: 11:00:00 via INTRAVENOUS

## 2016-10-02 MED ORDER — KETAMINE HCL 50 MG/ML IJ SOLN
INTRAMUSCULAR | Status: DC | PRN
  Administered 2016-10-02: 25 mg via INTRAVENOUS

## 2016-10-02 MED ORDER — DEXAMETHASONE SODIUM PHOSPHATE 4 MG/ML IJ SOLN
INTRAMUSCULAR | Status: DC | PRN
  Administered 2016-10-02: 5 mg via INTRAVENOUS

## 2016-10-02 MED ORDER — LIDOCAINE HCL (CARDIAC) 20 MG/ML IV SOLN
INTRAVENOUS | Status: DC | PRN
  Administered 2016-10-02: 100 mg via INTRAVENOUS

## 2016-10-02 MED ORDER — GLYCOPYRROLATE 0.2 MG/ML IJ SOLN
INTRAMUSCULAR | Status: DC | PRN
  Administered 2016-10-02: 0.2 mg via INTRAVENOUS

## 2016-10-02 MED ORDER — FENTANYL CITRATE (PF) 250 MCG/5ML IJ SOLN
INTRAMUSCULAR | Status: AC
  Filled 2016-10-02: qty 5

## 2016-10-02 MED ORDER — FENTANYL CITRATE (PF) 100 MCG/2ML IJ SOLN
INTRAMUSCULAR | Status: DC | PRN
  Administered 2016-10-02: 25 ug via INTRAVENOUS

## 2016-10-02 MED ORDER — MIDAZOLAM HCL 2 MG/2ML IJ SOLN
INTRAMUSCULAR | Status: DC | PRN
  Administered 2016-10-02: .5 mg via INTRAVENOUS

## 2016-10-02 MED ORDER — PROPOFOL 10 MG/ML IV BOLUS
INTRAVENOUS | Status: DC | PRN
  Administered 2016-10-02: 120 mg via INTRAVENOUS

## 2016-10-02 MED ORDER — FENTANYL CITRATE (PF) 100 MCG/2ML IJ SOLN: 25.0000 ug | INTRAMUSCULAR | Status: DC | PRN

## 2016-10-02 MED ORDER — GLYCOPYRROLATE 0.2 MG/ML IJ SOLN
INTRAMUSCULAR | Status: AC
  Filled 2016-10-02: qty 1

## 2016-10-02 SURGICAL SUPPLY — 37 items
BAG COUNTER SPONGE EZ (MISCELLANEOUS) IMPLANT
BAG SPNG 4X4 CLR HAZ (MISCELLANEOUS)
BANDAGE ACE 6X5 VEL STRL LF (GAUZE/BANDAGES/DRESSINGS) ×2 IMPLANT
BLADE FULL RADIUS 3.5 (BLADE) ×2 IMPLANT
BLADE SHAVER 4.5X7 STR FR (MISCELLANEOUS) ×2 IMPLANT
CHLORAPREP W/TINT 26ML (MISCELLANEOUS) ×2 IMPLANT
CUFF TOURN 24 STER (MISCELLANEOUS) IMPLANT
CUFF TOURN 30 STER DUAL PORT (MISCELLANEOUS) IMPLANT
DRAPE IMP U-DRAPE 54X76 (DRAPES) ×2 IMPLANT
ELECT REM PT RETURN 9FT ADLT (ELECTROSURGICAL) ×2
ELECTRODE REM PT RTRN 9FT ADLT (ELECTROSURGICAL) ×1 IMPLANT
GAUZE SPONGE 4X4 12PLY STRL (GAUZE/BANDAGES/DRESSINGS) ×2 IMPLANT
GAUZE XEROFORM 4X4 STRL (GAUZE/BANDAGES/DRESSINGS) ×1 IMPLANT
GLOVE BIO SURGEON STRL SZ8 (GLOVE) ×4 IMPLANT
GLOVE BIOGEL M 7.0 STRL (GLOVE) ×4 IMPLANT
GLOVE BIOGEL PI IND STRL 7.5 (GLOVE) ×1 IMPLANT
GLOVE BIOGEL PI INDICATOR 7.5 (GLOVE) ×1
GLOVE INDICATOR 8.0 STRL GRN (GLOVE) ×2 IMPLANT
GOWN STRL REUS W/ TWL LRG LVL3 (GOWN DISPOSABLE) ×1 IMPLANT
GOWN STRL REUS W/ TWL XL LVL3 (GOWN DISPOSABLE) ×2 IMPLANT
GOWN STRL REUS W/TWL LRG LVL3 (GOWN DISPOSABLE) ×2
GOWN STRL REUS W/TWL XL LVL3 (GOWN DISPOSABLE) ×4
IV LACTATED RINGER IRRG 3000ML (IV SOLUTION) ×2
IV LR IRRIG 3000ML ARTHROMATIC (IV SOLUTION) ×1 IMPLANT
KIT RM TURNOVER STRD PROC AR (KITS) ×2 IMPLANT
MANIFOLD NEPTUNE II (INSTRUMENTS) ×2 IMPLANT
NDL HYPO 21X1.5 SAFETY (NEEDLE) ×1 IMPLANT
NDL SAFETY ECLIPSE 18X1.5 (NEEDLE) IMPLANT
NEEDLE HYPO 18GX1.5 SHARP (NEEDLE) ×2
NEEDLE HYPO 21X1.5 SAFETY (NEEDLE) ×2 IMPLANT
PACK ARTHROSCOPY KNEE (MISCELLANEOUS) ×2 IMPLANT
PENCIL ELECTRO HAND CTR (MISCELLANEOUS) ×2 IMPLANT
SUT PROLENE 4 0 PS 2 18 (SUTURE) ×3 IMPLANT
SUT TICRON COATED BLUE 2 0 30 (SUTURE) IMPLANT
SYR 50ML LL SCALE MARK (SYRINGE) ×2 IMPLANT
TUBING ARTHRO INFLOW-ONLY STRL (TUBING) ×2 IMPLANT
WAND HAND CNTRL MULTIVAC 90 (MISCELLANEOUS) ×2 IMPLANT

## 2016-10-02 NOTE — Anesthesia Postprocedure Evaluation (Signed)
Anesthesia Post Note  Patient: Brad Randall  Procedure(s) Performed: Procedure(s) (LRB): KNEE ARTHROSCOPY WITH Partial lateral , & medial menisectomy (Left)  Patient location during evaluation: PACU Anesthesia Type: General Level of consciousness: awake and alert Pain management: pain level controlled Vital Signs Assessment: post-procedure vital signs reviewed and stable Respiratory status: spontaneous breathing, nonlabored ventilation, respiratory function stable and patient connected to nasal cannula oxygen Cardiovascular status: blood pressure returned to baseline and stable Postop Assessment: no signs of nausea or vomiting Anesthetic complications: no     Last Vitals:  Vitals:   10/02/16 1415 10/02/16 1420  BP:  (!) 159/95  Pulse: 70 70  Resp: 12 (!) 29  Temp:      Last Pain:  Vitals:   10/02/16 1056  TempSrc: Tympanic                 Precious Haws Tamalyn Wadsworth

## 2016-10-02 NOTE — H&P (Signed)
Paper H&P to be scanned into permanent record. H&P reviewed and patient re-examined. No changes. 

## 2016-10-02 NOTE — Anesthesia Post-op Follow-up Note (Cosign Needed)
Anesthesia QCDR form completed.        

## 2016-10-02 NOTE — Op Note (Signed)
10/02/2016  1:42 PM  Patient:   Brad Randall  Pre-Op Diagnosis:   Displaced bucket handle tear of lateral meniscus with complex tear of medial meniscus, left knee.  Postoperative diagnosis:   Same  Procedure:   Arthroscopic partial medial and lateral meniscectomies, left knee.  Surgeon:   Pascal Lux, M.D.  Anesthesia:   General LMA.  Findings:   As above. There were grade 1 chondromalacial changes involving the weightbearing portions of the medial femoral condyle and medial tibial plateau. Otherwise, the articular surfaces all were in satisfactory condition. The anterior and posterior cruciate ligaments both were in excellent condition.  Complications:   None.  EBL:   5 cc.  Total fluids:   550 cc of crystalloid.  Tourniquet time:   None  Drains:   None  Closure:   4-0 Prolene interrupted sutures.  Brief clinical note:   The patient is a 71 year old male with a several year history of intermittent painful catching of the left knee. Several weeks ago, his symptoms worsened after getting up from a kneeling position on the floor. Subsequent workup, including an MRI scan, confirmed the presence of a displaced bucket handle lateral meniscus tear. The MRI scan also demonstrated a complex tear of the posterior medial portion of the medial meniscus. The patient presents at this time for arthroscopy, debridement, and partial medial and lateral meniscectomies.  Procedure:   The patient was brought into the operating room and lain in the supine position. After adequate general laryngeal mask anesthesia was obtained, a timeout was performed to verify the appropriate side. The patient's left knee was injected sterilely using a solution of 30 cc of 1% lidocaine and 30 cc of 0.5% Sensorcaine with epinephrine. The left lower extremity was prepped with ChloraPrep solution before being draped sterilely. Preoperative antibiotics were administered. The expected portal sites were injected with 0.5%  Sensorcaine with epinephrine before the camera was placed in the anterolateral portal and instrumentation performed through the anteromedial portal. The knee was sequentially examined beginning in the suprapatellar pouch, then progressing to the patellofemoral space, the medial gutter compartment, the notch, and finally the lateral compartment and gutter. The findings were as described above. Abundant reactive synovial tissues anteriorly were debrided using the full-radius resector in order to improve visualization. The complex tear of the posterior medial portion of the meniscus was defined by probing before it was debrided back to stable margins using a combination of the mini much was in full radius resector. Subsequent probing demonstrated excellent stability. Laterally, the tear was actually a double bucket handle meniscus tear which was deemed irreparable. The unstable portions of meniscus were debrided out using a combination of straight and up-biting baskets, graspers, and the full-radius resector. Subsequent probing again demonstrated excellent stability of the remaining rim. The instruments were removed from the joint after suctioning the excess fluid. The portal sites were closed using 4-0 Prolene interrupted sutures before a sterile bulky dressing was applied to the knee. The patient was then awakened, extubated, and returned to the recovery room in satisfactory condition after tolerating the procedure well.

## 2016-10-02 NOTE — Transfer of Care (Signed)
Immediate Anesthesia Transfer of Care Note  Patient: Brad Randall  Procedure(s) Performed: Procedure(s): KNEE ARTHROSCOPY WITH Partial lateral , & medial menisectomy (Left)  Patient Location: PACU  Anesthesia Type:General  Level of Consciousness: awake and patient cooperative  Airway & Oxygen Therapy: Patient Spontanous Breathing and Patient connected to nasal cannula oxygen  Post-op Assessment: Report given to RN and Post -op Vital signs reviewed and stable  Post vital signs: Reviewed and stable  Last Vitals:  Vitals:   10/02/16 1056  BP: (!) 147/86  Pulse: (!) 50  Resp: 16  Temp: 36.3 C    Last Pain:  Vitals:   10/02/16 1056  TempSrc: Tympanic         Complications: No apparent anesthesia complications

## 2016-10-02 NOTE — Anesthesia Preprocedure Evaluation (Signed)
Anesthesia Evaluation  Patient identified by MRN, date of birth, ID band Patient awake    Reviewed: Allergy & Precautions, H&P , NPO status , Patient's Chart, lab work & pertinent test results  History of Anesthesia Complications Negative for: history of anesthetic complications  Airway Mallampati: II  TM Distance: >3 FB Neck ROM: limited    Dental  (+) Poor Dentition, Chipped   Pulmonary neg pulmonary ROS, neg shortness of breath,    Pulmonary exam normal breath sounds clear to auscultation       Cardiovascular Exercise Tolerance: Good hypertension, (-) angina(-) Past MI and (-) DOE Normal cardiovascular exam Rhythm:regular Rate:Normal     Neuro/Psych  Headaches, PSYCHIATRIC DISORDERS Depression    GI/Hepatic Neg liver ROS, GERD  Controlled and Medicated,  Endo/Other  negative endocrine ROS  Renal/GU      Musculoskeletal   Abdominal   Peds  Hematology negative hematology ROS (+)   Anesthesia Other Findings Past Medical History: No date: Anxiety and depression No date: Cancer ([place]) No date: Headache(784.0) No date: High cholesterol No date: HTN (hypertension) No date: Hyperlipidemia No date: Memory loss  Past Surgical History: 09/13/2015: COLONOSCOPY WITH PROPOFOL N/A     Comment: Procedure: COLONOSCOPY WITH PROPOFOL;                Surgeon: Hulen Luster, MD;  Location: [facility]               ENDOSCOPY;  Service: Gastroenterology;                Laterality: N/A; 09/30/2015: EUS N/A     Comment: Procedure: LOWER ENDOSCOPIC ULTRASOUND (EUS);               Surgeon: Holly Bodily, MD;  Location:               [facility] ENDOSCOPY;  Service: Gastroenterology;                Laterality: N/A;  BMI    Body Mass Index:  24.33 kg/m      Reproductive/Obstetrics negative OB ROS                             Anesthesia Physical Anesthesia Plan  ASA: III  Anesthesia Plan: General LMA    Post-op Pain Management:    Induction:   Airway Management Planned:   Additional Equipment:   Intra-op Plan:   Post-operative Plan:   Informed Consent: I have reviewed the patients History and Physical, chart, labs and discussed the procedure including the risks, benefits and alternatives for the proposed anesthesia with the patient or authorized representative who has indicated his/her understanding and acceptance.   Dental Advisory Given  Plan Discussed with: Anesthesiologist, CRNA and Surgeon  Anesthesia Plan Comments:         Anesthesia Quick Evaluation

## 2016-10-02 NOTE — Discharge Instructions (Addendum)
Keep dressing dry and intact.  May shower after dressing changed on post-op day #4 (Friday).  Cover sutures with Band-Aids after drying off. Apply ice frequently to knee. Take ibuprofen 600-800 mg TID with meals for 7-10 days, then as necessary. Take pain medication as prescribed or ES Tylenol when needed.  May weight-bear as tolerated - use crutches or walker as needed. Follow-up in 10-14 days or as scheduled.  AMBULATORY SURGERY  DISCHARGE INSTRUCTIONS   1) The drugs that you were given will stay in your system until tomorrow so for the next 24 hours you should not:  A) Drive an automobile B) Make any legal decisions C) Drink any alcoholic beverage   2) You may resume regular meals tomorrow.  Today it is better to start with liquids and gradually work up to solid foods.  You may eat anything you prefer, but it is better to start with liquids, then soup and crackers, and gradually work up to solid foods.   3) Please notify your doctor immediately if you have any unusual bleeding, trouble breathing, redness and pain at the surgery site, drainage, fever, or pain not relieved by medication.    4) Additional Instructions: TAKE A STOOL SOFTENER TWICE A DAY WHILE TAKING NARCOTIC PAIN MEDICINE TO PREVENT CONSTIPATION   Please contact your physician with any problems or Same Day Surgery at [phone], Monday through Friday 6 am to 4 pm, or [place] [facility] number at [phone].

## 2016-10-02 NOTE — Anesthesia Procedure Notes (Signed)
Procedure Name: LMA Insertion Date/Time: 10/02/2016 12:30 PM Performed by: Rosaria Ferries, Raymona Boss Pre-anesthesia Checklist: Patient identified, Emergency Drugs available, Suction available and Patient being monitored Patient Re-evaluated:Patient Re-evaluated prior to inductionOxygen Delivery Method: Circle system utilized Preoxygenation: Pre-oxygenation with 100% oxygen Intubation Type: IV induction LMA: LMA inserted LMA Size: 4.5 Number of attempts: 1 Placement Confirmation: breath sounds checked- equal and bilateral Dental Injury: Teeth and Oropharynx as per pre-operative assessment

## 2016-10-03 ENCOUNTER — Encounter: Payer: Self-pay | Admitting: Surgery

## 2016-10-04 ENCOUNTER — Encounter: Payer: Self-pay | Admitting: Physical Therapy

## 2016-10-12 ENCOUNTER — Ambulatory Visit: Payer: Medicare Other | Attending: Gastroenterology

## 2016-10-12 DIAGNOSIS — R2689 Other abnormalities of gait and mobility: Secondary | ICD-10-CM | POA: Diagnosis present

## 2016-10-12 DIAGNOSIS — R279 Unspecified lack of coordination: Secondary | ICD-10-CM

## 2016-10-12 DIAGNOSIS — M25662 Stiffness of left knee, not elsewhere classified: Secondary | ICD-10-CM | POA: Insufficient documentation

## 2016-10-12 DIAGNOSIS — M6281 Muscle weakness (generalized): Secondary | ICD-10-CM

## 2016-10-13 NOTE — Therapy (Signed)
[place] MAIN [facility] SERVICES [address] [place], [place], [address] Phone: [phone]   Fax:  [phone]  Physical Therapy Evaluation  Patient Details  Name: Brad Randall MRN: [id number] Date of Birth: 1945-11-13 Referring Provider: Dr. Roland Rack  Encounter Date: 10/12/2016      PT End of Session - 10/12/16 1513    Visit Number 1   Number of Visits 12   Date for PT Re-Evaluation 11/23/16   Authorization Type 1/10 G code   PT Start Time 1022   PT Stop Time 1115   PT Time Calculation (min) 53 min   Activity Tolerance Patient tolerated treatment well;No increased pain   Behavior During Therapy WFL for tasks assessed/performed      Past Medical History:  Diagnosis Date  . Anxiety and depression   . Cancer ([place])   . Headache(784.0)   . High cholesterol   . HTN (hypertension)   . Hyperlipidemia   . Memory loss     Past Surgical History:  Procedure Laterality Date  . COLONOSCOPY WITH PROPOFOL N/A 09/13/2015   Procedure: COLONOSCOPY WITH PROPOFOL;  Surgeon: Hulen Luster, MD;  Location: [facility] ENDOSCOPY;  Service: Gastroenterology;  Laterality: N/A;  . EUS N/A 09/30/2015   Procedure: LOWER ENDOSCOPIC ULTRASOUND (EUS);  Surgeon: Holly Bodily, MD;  Location: [facility] ENDOSCOPY;  Service: Gastroenterology;  Laterality: N/A;  . KNEE ARTHROSCOPY WITH MENISCAL REPAIR Left 10/02/2016   Procedure: KNEE ARTHROSCOPY WITH Partial lateral , & medial menisectomy;  Surgeon: Corky Mull, MD;  Location: [facility] ORS;  Service: Orthopedics;  Laterality: Left;    There were no vitals filed for this visit.       Subjective Assessment - 10/12/16 1031    Subjective Patient reports increased L knee dysfunction s/p menisus surgery on the L knee. Patient reports having knee pain for 3 years after having an accident when helping a friend move a refridgerator and falling on his both his legs. More recently when returning from a squatting positioning and felt a  strong catch. Patient reports no pain currently. Patient demonstrates difficulty with performing stairs (educated patient to stop perforiming stairs), turning when standing, and sitting for prolonged periods of time    Pertinent History Hx of CA   Limitations Lifting;Standing;Walking   Patient Stated Goals To return back to full functioning   Currently in Pain? No/denies            [facility] PT Assessment - 10/12/16 1045      Assessment   Medical Diagnosis L meniscus tear surgery   Referring Provider Dr. Roland Rack   Onset Date/Surgical Date 10/02/16   Hand Dominance Right   Next MD Visit unknown     Balance Screen   Has the patient fallen in the past 6 months No   Has the patient had a decrease in activity level because of a fear of falling?  No   Is the patient reluctant to leave their home because of a fear of falling?  No     Home Ec[place] residence   Living Arrangements Spouse/significant other   Available Help at Discharge Family   Type of [place] One level   [place] None     Prior Function   Level of Independence Independent   Vocation Retired   Biomedical scientist N/A   Leisure hiking, computer work, Doctor, general practice  Overall Cognitive Status Within Functional Limits for tasks assessed      Observation: MMT: LE: on R: WNL: on L: Hip flexion: 4/5, Hip abduction: 4/5 Hip Adduction: 4/5; (Did not perform hip ER or IR secondary to decrease knee torsion); Knee Flexion/ext: Able to maintain isometric contraction without increase in pain; Ankle Dorsiflexion: WNL  AROM: Knee on L: extension: 0; flexion: 90 degrees; Hip: WNL  LEFS: 43/80   Ambulation: Shortened step length Bilaterally, slight forward rounded trunk, no major antalgic portion of gait noted.  Education: Educated to not perform increased bending over the L knee such as performing stairs, bending to lift things from the  ground  TREATMENT: Hip extension/abduction in standing -- x20 Hamstring/quadriceps in sitting -- 5 x 5sec holds Seated ball roll outs -- x 20          PT Education - 10/12/16 1507    Education provided Yes   Education Details HEP: hip ext, hip abduction, isometric quad/hamstring, Seated ball roll outs under foot   Person(s) Educated Patient   Methods Explanation;Demonstration   Comprehension Verbalized understanding;Returned demonstration             PT Long Term Goals - 10/12/16 1722      PT LONG TERM GOAL #1   Title Patient will improve LEFS score to 64/80 to demonstrate improvement in knee function and improved ability to ambulate.   Baseline LEFS: 43/80   Time 8   Period Weeks   Status New     PT LONG TERM GOAL #2   Title Patient will be independent with HEP to continue benefits of therapy after discharge.    Baseline dependent with exercise performance    Time 8   Period Weeks   Status New     PT LONG TERM GOAL #3   Title Pt with demonstrate full knee AROM to allow for greater ease with walking and functional bending/squating activities   Baseline Pt knee AROM:  0- 90deg   Time 12   Period Weeks   Status New     PT LONG TERM GOAL #4   Title Pt will be able to balance in SLS B for >10 sec to demonstrate improvement in balance and propioception leading to decreased fall risk   Baseline Not assessed on initial visit   Time 12   Period Weeks   Status New               Plan - 10/12/16 1646    Clinical Impression Statement Pt is a 71 yo right hand dominant male experiencing L knee dysfunction as indicated by decreased LEFS score, decreased AROM, strength, and coordination of the knee and hip. Patient demonstrates no extensor lag and will benefit from further skilled therapy to improve knee function to return to prior level of function.    Rehab Potential Good   Clinical Impairments Affecting Rehab Potential (+) highly motivated, family support (-)  history of CA   PT Frequency 2x / week   PT Duration 8 weeks   PT Treatment/Interventions Aquatic Therapy;ADLs/Self Care Home Management;Moist Heat;Iontophoresis 4mg /ml Dexamethasone;Ultrasound;Therapeutic exercise;Balance training;Therapeutic activities;Gait training;Neuromuscular re-education;Patient/family education;Passive range of motion;Manual techniques;Dry needling   PT Next Visit Plan Isometric quad/hamstring; hip strengthening   PT Home Exercise Plan quad/hamstring iso's, ball roll outs under foot, hip ext/abd in standing   Consulted and Agree with Plan of Care Patient      Patient will benefit from skilled therapeutic intervention in order to improve the following deficits and  impairments:  Decreased balance, Decreased endurance, Decreased range of motion, Decreased strength, Abnormal gait, Decreased coordination, Increased muscle spasms, Difficulty walking, Increased fascial restricitons, Decreased mobility  Visit Diagnosis: Unspecified lack of coordination  Other abnormalities of gait and mobility  Muscle weakness (generalized)  Stiffness of left knee, not elsewhere classified      G-Codes - Nov 10, 2016 0920    Functional Assessment Tool Used (Outpatient Only) LEFS, clinical judgement, MMT, AROM, SLS balance   Functional Limitation Mobility: Walking and moving around   Mobility: Walking and Moving Around Current Status [phone]) At least 20 percent but less than 40 percent impaired, limited or restricted   Mobility: Walking and Moving Around Goal Status [phone]) At least 1 percent but less than 20 percent impaired, limited or restricted       Problem List Patient Active Problem List   Diagnosis Date Noted  . Anal cancer ([place]) 10/06/2015  . Mood disorder ([place]) 06/14/2015  . Memory loss 12/03/2012  . 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 Unspecified essential hypertension 12/03/2012  . Other and unspecified hyperlipidemia 12/03/2012  . HA (headache) 02/19/2012    Blythe Stanford, PT DPT 11-10-16, 9:22 AM  [place] MAIN [facility] SERVICES [address] [place], [place], [address] Phone: [phone]   Fax:  [phone]  Name: Brad Randall MRN: [id number] Date of Birth: 1945/11/15

## 2016-10-16 ENCOUNTER — Encounter: Payer: Self-pay | Admitting: Physical Therapy

## 2016-10-17 ENCOUNTER — Ambulatory Visit: Payer: Medicare Other

## 2016-10-17 DIAGNOSIS — R279 Unspecified lack of coordination: Secondary | ICD-10-CM

## 2016-10-17 DIAGNOSIS — M6281 Muscle weakness (generalized): Secondary | ICD-10-CM

## 2016-10-17 DIAGNOSIS — R2689 Other abnormalities of gait and mobility: Secondary | ICD-10-CM

## 2016-10-17 DIAGNOSIS — M25662 Stiffness of left knee, not elsewhere classified: Secondary | ICD-10-CM

## 2016-10-17 NOTE — Therapy (Signed)
[place] MAIN [facility] SERVICES [address] [place], [place], [address] Phone: [phone]   Fax:  [phone]  Physical Therapy Treatment  Patient Details  Name: Brad Randall MRN: [id number] Date of Birth: 11-May-1946 Referring Provider: Dr. Roland Rack  Encounter Date: 10/17/2016      PT End of Session - 10/17/16 0812    Visit Number 2   Number of Visits 12   Date for PT Re-Evaluation 11/23/16   Authorization Type 2/10 G code   PT Start Time 0802   PT Stop Time 0845   PT Time Calculation (min) 43 min   Activity Tolerance Patient tolerated treatment well;No increased pain   Behavior During Therapy WFL for tasks assessed/performed      Past Medical History:  Diagnosis Date  . Anxiety and depression   . Cancer ([place])   . Headache(784.0)   . High cholesterol   . HTN (hypertension)   . Hyperlipidemia   . Memory loss     Past Surgical History:  Procedure Laterality Date  . COLONOSCOPY WITH PROPOFOL N/A 09/13/2015   Procedure: COLONOSCOPY WITH PROPOFOL;  Surgeon: Hulen Luster, MD;  Location: [facility] ENDOSCOPY;  Service: Gastroenterology;  Laterality: N/A;  . EUS N/A 09/30/2015   Procedure: LOWER ENDOSCOPIC ULTRASOUND (EUS);  Surgeon: Holly Bodily, MD;  Location: [facility] ENDOSCOPY;  Service: Gastroenterology;  Laterality: N/A;  . KNEE ARTHROSCOPY WITH MENISCAL REPAIR Left 10/02/2016   Procedure: KNEE ARTHROSCOPY WITH Partial lateral , & medial menisectomy;  Surgeon: Corky Mull, MD;  Location: [facility] ORS;  Service: Orthopedics;  Laterality: Left;    There were no vitals filed for this visit.      Subjective Assessment - 10/17/16 0807    Subjective Patient reports he's having difficulty with going down the stairs and educated patient to decrease weight bearing over the L knee, especially when performing descension on the stairs    Pertinent History Hx of CA   Limitations Lifting;Standing;Walking   Patient Stated Goals To return back to full  functioning   Currently in Pain? No/denies      TREATMENT: Hip extension/abduction in standing with unilateral UE support - 2 x20 Seated hip adduction with glute squeeze - x20  Seated hip abduction with GTB - 2x 20  Retrostep with R leg in front with ant/post weight shift - x 20  Hamstring/quadriceps in sitting -- 3 x 30sec holds each direction Manual therapy: Patellar mobilizations in long sitting, lateral/medial - 3 x 30sec each to improve patellar motion with functional activity with exercise.       PT Education - 10/17/16 [phone]    Education provided Yes   Education Details Form/technique with exercise; educated to decrease weight bearing for descension on the stairs   Person(s) Educated Patient   Methods Explanation;Demonstration   Comprehension Verbalized understanding;Returned demonstration             PT Long Term Goals - 10/12/16 1722      PT LONG TERM GOAL #1   Title Patient will improve LEFS score to 64/80 to demonstrate improvement in knee function and improved ability to ambulate.   Baseline LEFS: 43/80   Time 8   Period Weeks   Status New     PT LONG TERM GOAL #2   Title Patient will be independent with HEP to continue benefits of therapy after discharge.    Baseline dependent with exercise performance    Time 8   Period Weeks   Status  New     PT LONG TERM GOAL #3   Title Pt with demonstrate full knee AROM to allow for greater ease with walking and functional bending/squating activities   Baseline Pt knee AROM:  0- 90deg   Time 12   Period Weeks   Status New     PT LONG TERM GOAL #4   Title Pt will be able to balance in SLS B for >10 sec to demonstrate improvement in balance and propioception leading to decreased fall risk   Baseline Not assessed on initial visit   Time 12   Period Weeks   Status New               Plan - 10/17/16 0848    Clinical Impression Statement Focused on educating patient and performing hip strengthening, iso  hamstrings/quad strengthening and patellar mobilizations to improve knee function. Patient demonstrates good quad activation but increased weakness along the hips and pt will benefit from further skilled therapy to return to prior level of function.    Rehab Potential Good   Clinical Impairments Affecting Rehab Potential (+) highly motivated, family support (-) history of CA   PT Frequency 2x / week   PT Duration 8 weeks   PT Treatment/Interventions Aquatic Therapy;ADLs/Self Care Home Management;Moist Heat;Iontophoresis 4mg /ml Dexamethasone;Ultrasound;Therapeutic exercise;Balance training;Therapeutic activities;Gait training;Neuromuscular re-education;Patient/family education;Passive range of motion;Manual techniques;Dry needling   PT Next Visit Plan Isometric quad/hamstring; hip strengthening   PT Home Exercise Plan quad/hamstring iso's, ball roll outs under foot, hip ext/abd in standing   Consulted and Agree with Plan of Care Patient      Patient will benefit from skilled therapeutic intervention in order to improve the following deficits and impairments:  Decreased balance, Decreased endurance, Decreased range of motion, Decreased strength, Abnormal gait, Decreased coordination, Increased muscle spasms, Difficulty walking, Increased fascial restricitons, Decreased mobility  Visit Diagnosis: Unspecified lack of coordination  Other abnormalities of gait and mobility  Muscle weakness (generalized)  Stiffness of left knee, not elsewhere classified     Problem List Patient Active Problem List   Diagnosis Date Noted  . Anal cancer ([place]) 10/06/2015  . Mood disorder ([place]) 06/14/2015  . Memory loss 12/03/2012  . 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 Unspecified essential hypertension 12/03/2012  . Other and unspecified hyperlipidemia 12/03/2012  . HA (headache) 02/19/2012    Blythe Stanford, PT DPT 10/17/2016, 8:53 AM  [place] MAIN [facility]  SERVICES [address] [place], [place], [address] Phone: [phone]   Fax:  [phone]  Name: Brad Randall MRN: [id number] Date of Birth: 02-19-1946

## 2016-10-19 ENCOUNTER — Encounter: Payer: Self-pay | Admitting: *Deleted

## 2016-10-20 ENCOUNTER — Encounter: Payer: Self-pay | Admitting: Anesthesiology

## 2016-10-20 ENCOUNTER — Encounter: Payer: Self-pay | Admitting: *Deleted

## 2016-10-20 ENCOUNTER — Ambulatory Visit
Admission: RE | Admit: 2016-10-20 | Discharge: 2016-10-20 | Disposition: A | Discharge status: Home / Self Care | Payer: Medicare Other | Source: Ambulatory Visit | Attending: Gastroenterology | Admitting: Gastroenterology

## 2016-10-20 ENCOUNTER — Encounter
Admission: RE | Disposition: A | Discharge status: Home / Self Care | Payer: Self-pay | Source: Ambulatory Visit | Attending: Gastroenterology

## 2016-10-20 DIAGNOSIS — Z7951 Long term (current) use of inhaled steroids: Secondary | ICD-10-CM | POA: Diagnosis not present

## 2016-10-20 DIAGNOSIS — F329 Major depressive disorder, single episode, unspecified: Secondary | ICD-10-CM | POA: Insufficient documentation

## 2016-10-20 DIAGNOSIS — Z79899 Other long term (current) drug therapy: Secondary | ICD-10-CM | POA: Diagnosis not present

## 2016-10-20 DIAGNOSIS — Z85048 Personal history of other malignant neoplasm of rectum, rectosigmoid junction, and anus: Secondary | ICD-10-CM | POA: Diagnosis not present

## 2016-10-20 DIAGNOSIS — F419 Anxiety disorder, unspecified: Secondary | ICD-10-CM | POA: Diagnosis not present

## 2016-10-20 DIAGNOSIS — K5520 Angiodysplasia of colon without hemorrhage: Secondary | ICD-10-CM | POA: Insufficient documentation

## 2016-10-20 DIAGNOSIS — Z85828 Personal history of other malignant neoplasm of skin: Secondary | ICD-10-CM | POA: Diagnosis not present

## 2016-10-20 DIAGNOSIS — E7800 Pure hypercholesterolemia, unspecified: Secondary | ICD-10-CM | POA: Diagnosis not present

## 2016-10-20 DIAGNOSIS — Z9221 Personal history of antineoplastic chemotherapy: Secondary | ICD-10-CM | POA: Diagnosis not present

## 2016-10-20 DIAGNOSIS — Z923 Personal history of irradiation: Secondary | ICD-10-CM | POA: Insufficient documentation

## 2016-10-20 DIAGNOSIS — K219 Gastro-esophageal reflux disease without esophagitis: Secondary | ICD-10-CM | POA: Insufficient documentation

## 2016-10-20 DIAGNOSIS — I10 Essential (primary) hypertension: Secondary | ICD-10-CM | POA: Insufficient documentation

## 2016-10-20 DIAGNOSIS — E785 Hyperlipidemia, unspecified: Secondary | ICD-10-CM | POA: Insufficient documentation

## 2016-10-20 HISTORY — DX: Major depressive disorder, single episode, unspecified: F32.9

## 2016-10-20 HISTORY — DX: Depression, unspecified: F32.A

## 2016-10-20 HISTORY — DX: Gastro-esophageal reflux disease without esophagitis: K21.9

## 2016-10-20 HISTORY — DX: Syncope and collapse: R55

## 2016-10-20 HISTORY — PX: FLEXIBLE SIGMOIDOSCOPY: SHX5431

## 2016-10-20 SURGERY — SIGMOIDOSCOPY, FLEXIBLE
Anesthesia: General

## 2016-10-20 MED ORDER — SODIUM CHLORIDE 0.9 % IV SOLN
INTRAVENOUS | Status: DC
  Administered 2016-10-20: 1000 mL via INTRAVENOUS

## 2016-10-20 MED ORDER — SODIUM CHLORIDE 0.9 % IV SOLN: INTRAVENOUS | Status: DC

## 2016-10-20 MED ORDER — MIDAZOLAM HCL 5 MG/5ML IJ SOLN
INTRAMUSCULAR | Status: AC
  Filled 2016-10-20: qty 5

## 2016-10-20 MED ORDER — MIDAZOLAM HCL 5 MG/5ML IJ SOLN
INTRAMUSCULAR | Status: DC | PRN
  Administered 2016-10-20: 2 mg via INTRAVENOUS
  Administered 2016-10-20 (×2): 1 mg via INTRAVENOUS

## 2016-10-20 MED ORDER — LIDOCAINE HCL 2 % EX GEL
CUTANEOUS | Status: AC
  Filled 2016-10-20: qty 5

## 2016-10-20 MED ORDER — FENTANYL CITRATE (PF) 100 MCG/2ML IJ SOLN
INTRAMUSCULAR | Status: AC
  Filled 2016-10-20: qty 2

## 2016-10-20 NOTE — H&P (Signed)
Outpatient short stay form Pre-procedure 10/20/2016 3:54 PM Brad Sails MD  Primary Physician: Dr. Maryland Pink  Reason for visit:  Sedated flexible sigmoidoscopy  History of present illness:  Patient is a 71 year old male presenting today as above. 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 Subsequently he has undergone chemoradiation treatment was completed in May 2017. He is presenting today for his first follow-up evaluation. He is also due for a scanning procedure in the next couple of months. He tolerated his enemas well. He has noticed a small amount of bleeding yesterday actually before he used the prep. He takes no aspirin or blood thinning agents.   Current Facility-Administered Medications:  .  fentaNYL (SUBLIMAZE) 100 MCG/2ML injection, , , ,  .  midazolam (VERSED) 5 MG/5ML injection, , , ,  .  0.9 %  sodium chloride infusion, , Intravenous, Continuous, Brad Sails, MD, Last Rate: 20 mL/hr at 10/20/16 1424, 1,000 mL at 10/20/16 1424 .  0.9 %  sodium chloride infusion, , Intravenous, Continuous, Brad Sails, MD  Prescriptions Prior to Admission  Medication Sig Dispense Refill Last Dose  . bisoprolol-hydrochlorothiazide (ZIAC) 2.5-6.25 MG per tablet Take 1 tablet by mouth 2 (two) times daily.    10/20/2016 at Un0900known time  . escitalopram (LEXAPRO) 10 MG tablet Take 15 mg by mouth daily.   1 10/20/2016 at 0900  . fluticasone (FLONASE) 50 MCG/ACT nasal spray Place 2 sprays into both nostrils daily as needed for rhinitis.    10/19/2016 at Unknown time  . KLOR-CON 10 10 MEQ tablet Take 10 mEq by mouth daily.  5 10/19/2016 at Unknown time  . Multiple Vitamins-Minerals (MULTIVITAMIN WITH MINERALS) tablet Take 1 tablet by mouth daily.   10/19/2016 at Unknown time  . omeprazole (PRILOSEC) 40 MG capsule Take 40 mg by mouth every evening.    Past Week at Unknown time  . sildenafil (REVATIO) 20 MG tablet Take 1-5 prn   Past  Week at Unknown time  . sodium bicarbonate 650 MG tablet Take 1,300 mg by mouth every evening. Reported on 01/31/2016   10/19/2016 at Unknown time  . zonisamide (ZONEGRAN) 25 MG capsule Take 75 mg by mouth at bedtime.    10/19/2016 at Unknown time  . HYDROcodone-acetaminophen (NORCO) 5-325 MG tablet Take 1-2 tablets by mouth every 6 (six) hours as needed for moderate pain. MAXIMUM TOTAL ACETAMINOPHEN DOSE IS 4000 MG PER DAY (Patient not taking: Reported on 10/20/2016) 30 tablet 0 Not Taking at Unknown time     Allergies  Allergen Reactions  . Sertraline Other (See Comments)    Makes skin crawl  . Topiramate Other (See Comments)    Mania     Past Medical History:  Diagnosis Date  . Anxiety and depression   . Cancer ([place])    Skin Cancer;      Squamous cell carcinoma of rectum  . Depression   . GERD (gastroesophageal reflux disease)   . Headache(784.0)    Migraines  . High cholesterol   . HTN (hypertension)   . Hyperlipidemia   . Hyperlipidemia   . Memory loss   . Syncopal episodes     Review of systems:      Physical Exam    Heart and lungs: Regular rate and rhythm without rub or gallop, lungs are bilaterally clear.    HEENT: Normocephalic atraumatic eyes are anicteric    Other:     Pertinant exam for  procedure: Soft nontender nondistended bowel sounds positive normoactive.    Planned proceedures: Flexible sigmoidoscopy and indicated procedures. I have discussed the risks benefits and complications of procedures to include not limited to bleeding, infection, perforation and the risk of sedation and the patient wishes to proceed.    Brad Sails, MD Gastroenterology 10/20/2016  3:54 PM

## 2016-10-20 NOTE — OR Nursing (Signed)
Procedure complete. Patient taken to post-op.

## 2016-10-20 NOTE — Op Note (Signed)
[facility] Gastroenterology Patient Name: Brad Randall Procedure Date: 10/20/2016 3:43 PM MRN: [id number] Account #: [id number] Date of Birth: July 26, 1946 Admit Type: Outpatient Age: 71 Room: [facility] ENDO ROOM 3 Gender: Male Note Status: Finalized Procedure:            Flexible Sigmoidoscopy Indications:          Personal history of malignant neoplasm of the anal canal Providers:            [name], MD Medicines:            Midazolam 4 mg IV Complications:        No immediate complications. Procedure:            Pre-Anesthesia Assessment:                       - ASA Grade Assessment: II - A patient with mild                        systemic disease.                       After obtaining informed consent, the scope was passed                        under direct vision. The was introduced through the                        anus and advanced to the 30 cm from the anal verge. The                        Colonoscope was introduced through the anus and                        advanced to the 30 cm from the anal verge. The flexible                        sigmoidoscopy was accomplished without difficulty. The                        patient tolerated the procedure well. The quality of                        the bowel preparation was good. I was unable to get the                        190 colonoscopy to retroflex easily in the rectal vault                        and switched to a H190 endoscope. On inspection of the                        photographs, a possible anomaly was noted and scope                        reintroduced for further evaluation. Findings:      The digital rectal exam was normal.      Multiple small diffuse angioectasias with bleeding on contact were found       in the rectum.  The retroflexed view of the distal rectum and anal verge was normal and       showed no anal or rectal abnormalities with the exception of       angioectasias noted  above. There is a small normal appearing anal pillar       noted, no abnormality noted on close inspection. There was no evidence       of recurrent or residual lesion, comparison made with previous pictures       during procedure.      The scope was advanced to 30 cm from the anal verge with no other       lesions noted. Impression:           - Multiple colonic angioectasias.                       - No specimens collected. Recommendation:       - Repeat flexible sigmoidoscopy in 6 months for                        surveillance. 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 All rights reserved. The codes documented in this report are preliminary and upon coder review may  be revised to meet current compliance requirements. [name], MD 10/20/2016 5:03:59 PM This report has been signed electronically. Number of Addenda: 0 Note Initiated On: 10/20/2016 3:43 PM Total Procedure Duration: 0 hours 15 minutes 13 seconds       [facility]

## 2016-10-20 NOTE — OR Nursing (Signed)
Patient taken from room endo 3 to post-op. Upon inspection of procedural pictures, Dr. Gwenlyn Perking determined a need to bring patient back to procedure room for a biopsy. Pt. Spouse was notified of need and gave verbal agreement. Patient returned to procedure room 1 for follow-up.

## 2016-10-23 ENCOUNTER — Encounter: Payer: Self-pay | Admitting: Gastroenterology

## 2016-10-24 ENCOUNTER — Ambulatory Visit: Payer: Medicare Other

## 2016-10-24 DIAGNOSIS — R279 Unspecified lack of coordination: Secondary | ICD-10-CM

## 2016-10-24 DIAGNOSIS — M6281 Muscle weakness (generalized): Secondary | ICD-10-CM

## 2016-10-24 DIAGNOSIS — R2689 Other abnormalities of gait and mobility: Secondary | ICD-10-CM

## 2016-10-24 NOTE — Therapy (Signed)
[place] MAIN [facility] SERVICES [address] [place], [place], [address] Phone: [phone]   Fax:  [phone]  Physical Therapy Treatment  Patient Details  Name: Brad Randall MRN: [id number] Date of Birth: 1946/07/26 Referring Provider: Dr. Roland Rack  Encounter Date: 10/24/2016      PT End of Session - 10/24/16 0920    Visit Number 3   Number of Visits 12   Date for PT Re-Evaluation 11/23/16   Authorization Type 3/10 G code   PT Start Time 0900   PT Stop Time 0945   PT Time Calculation (min) 45 min   Activity Tolerance Patient tolerated treatment well;No increased pain   Behavior During Therapy WFL for tasks assessed/performed      Past Medical History:  Diagnosis Date  . Anxiety and depression   . Cancer ([place])    Skin Cancer;      Squamous cell carcinoma of rectum  . Depression   . GERD (gastroesophageal reflux disease)   . Headache(784.0)    Migraines  . High cholesterol   . HTN (hypertension)   . Hyperlipidemia   . Hyperlipidemia   . Memory loss   . Syncopal episodes     Past Surgical History:  Procedure Laterality Date  . COLONOSCOPY WITH PROPOFOL N/A 09/13/2015   Procedure: COLONOSCOPY WITH PROPOFOL;  Surgeon: Hulen Luster, MD;  Location: [facility] ENDOSCOPY;  Service: Gastroenterology;  Laterality: N/A;  . EUS N/A 09/30/2015   Procedure: LOWER ENDOSCOPIC ULTRASOUND (EUS);  Surgeon: Holly Bodily, MD;  Location: [facility] ENDOSCOPY;  Service: Gastroenterology;  Laterality: N/A;  . FLEXIBLE SIGMOIDOSCOPY N/A 10/20/2016   Procedure: FLEXIBLE SIGMOIDOSCOPY;  Surgeon: [name], MD;  Location[facility] ENDOSCOPY;  Service: Endoscopy;  Laterality: N/A;  . KNEE ARTHROSCOPY WITH MENISCAL REPAIR Left 10/02/2016   Procedure: KNEE ARTHROSCOPY WITH Partial lateral , & medial menisectomy;  Surgeon: Corky Mull, MD;  Location: [facility] ORS;  Service: Orthopedics;  Laterality: Left;  . MOHS SURGERY      There were no vitals filed for this  visit.      Subjective Assessment - 10/24/16 0907    Subjective Patient reports he's doing well and states he knee continues to improve in function. Patient states he's been performing ADL with limited performance of stairs secondary to increased pain and to not aggravate knees symptoms.   Pertinent History Hx of CA   Limitations Lifting;Standing;Walking   Patient Stated Goals To return back to full functioning   Currently in Pain? No/denies        TREATMENT: Bridges in [place] - with focus on performing glute activation 2 x 20 SLR in supine with focus on activation quadriceps control - 2 x 20 Hamstring/quadriceps in sitting -- 2 x 30sec holds each direction Single leg stance on B side - 90 sec with finger-tip support Retrostep B with one foot in front with ant/post weight shift - x 20  Hip extension/abduction in standing with unilateral UE support - 2 x20  Manual therapy: Patellar mobilizations in long sitting, lateral/medial, ant/post - 3 x 30sec each to improve patellar motion with functional activity with exercise.        PT Education - 10/24/16 0919    Education provided Yes   Education Details Form/technique with exercise   Person(s) Educated Patient   Methods Explanation;Demonstration   Comprehension Verbalized understanding;Returned demonstration             PT Long Term Goals - 10/12/16 1722  PT LONG TERM GOAL #1   Title Patient will improve LEFS score to 64/80 to demonstrate improvement in knee function and improved ability to ambulate.   Baseline LEFS: 43/80   Time 8   Period Weeks   Status New     PT LONG TERM GOAL #2   Title Patient will be independent with HEP to continue benefits of therapy after discharge.    Baseline dependent with exercise performance    Time 8   Period Weeks   Status New     PT LONG TERM GOAL #3   Title Pt with demonstrate full knee AROM to allow for greater ease with walking and functional bending/squating  activities   Baseline Pt knee AROM:  0- 90deg   Time 12   Period Weeks   Status New     PT LONG TERM GOAL #4   Title Pt will be able to balance in SLS B for >10 sec to demonstrate improvement in balance and propioception leading to decreased fall risk   Baseline Not assessed on initial visit   Time 12   Period Weeks   Status New               Plan - 10/24/16 3212    Clinical Impression Statement Focused on improving quad strengthening, activation and motor control. Patient demonstrates decreased patellar moveemnt and required manual therapy to improve mobility. Patient is making progress and will contrinue to benefit from further skilled therapy to return to prior level of function.    Rehab Potential Good   Clinical Impairments Affecting Rehab Potential (+) highly motivated, family support (-) history of CA   PT Frequency 2x / week   PT Duration 8 weeks   PT Treatment/Interventions Aquatic Therapy;ADLs/Self Care Home Management;Moist Heat;Iontophoresis 4mg /ml Dexamethasone;Ultrasound;Therapeutic exercise;Balance training;Therapeutic activities;Gait training;Neuromuscular re-education;Patient/family education;Passive range of motion;Manual techniques;Dry needling   PT Next Visit Plan Isometric quad/hamstring; hip strengthening   PT Home Exercise Plan quad/hamstring iso's, ball roll outs under foot, hip ext/abd in standing   Consulted and Agree with Plan of Care Patient      Patient will benefit from skilled therapeutic intervention in order to improve the following deficits and impairments:  Decreased balance, Decreased endurance, Decreased range of motion, Decreased strength, Abnormal gait, Decreased coordination, Increased muscle spasms, Difficulty walking, Increased fascial restricitons, Decreased mobility  Visit Diagnosis: Unspecified lack of coordination  Other abnormalities of gait and mobility  Muscle weakness (generalized)     Problem List Patient Active  Problem List   Diagnosis Date Noted  . Anal cancer ([place]) 10/06/2015  . Mood disorder ([place]) 06/14/2015  . Memory loss 12/03/2012  . 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 Unspecified essential hypertension 12/03/2012  . Other and unspecified hyperlipidemia 12/03/2012  . HA (headache) 02/19/2012    Blythe Stanford, PT DPT 10/24/2016, 9:48 AM  [place] MAIN [facility] SERVICES [address] [place], [place], [address] Phone: [phone]   Fax:  [phone]  Name: MONTERRIO GERST MRN: [id number] Date of Birth: 11-21-1945

## 2016-10-25 ENCOUNTER — Ambulatory Visit: Payer: Medicare Other | Admitting: Physical Therapy

## 2016-10-25 DIAGNOSIS — R279 Unspecified lack of coordination: Secondary | ICD-10-CM | POA: Diagnosis not present

## 2016-10-25 DIAGNOSIS — R2689 Other abnormalities of gait and mobility: Secondary | ICD-10-CM

## 2016-10-25 DIAGNOSIS — M6281 Muscle weakness (generalized): Secondary | ICD-10-CM

## 2016-10-25 NOTE — Therapy (Addendum)
[place] MAIN [facility] SERVICES [address] [place], [place], [address] Phone: [phone]   Fax:  [phone]  Patient Details  Name: Brad Randall MRN: [id number] Date of Birth: 1946-03-12 Referring Provider:    Encounter Date: 10/25/2016  Pelvic Health PT Visit/  Discharge Summary    Pt arrived today with improved gait speed after undergoing mensicus surgery with f/u PT with another PT at [facility]. Reassessed goals today for d/c. Since starting [facility], pt reports his fecal leakage has improved "A Very Great Deal Better" based on the [facility].   Pt reported he no longer wears a pad and has not had fecal leakage for the past 2 months. Pt showed significantly improved pelvic floor coordination with proper lengthening/ contraction and increased in pelvic floor strength. Pt also showed proper engagement of pelvic floor mm with lifting and functional activities.  Pt has achieved 100% of his goals and is ready for d/c.          PT Long Term Goals - 10/25/16 1615      PT LONG TERM GOAL #1   Title Pt will decrease his COREFO score from  % to   < %  in order to show improved bowel function   Time 12   Period Weeks   Status Deferred     PT LONG TERM GOAL #2   Title Pt will demo proper pelvic floor coordination with diaphragm and TrA mm in order to progress towards strengthening exercises   Time 12   Period Weeks   Status Achieved     PT LONG TERM GOAL #3   Title Pt will demo proper contraction of pelvic floor mm in order to decrease leakage and promote blood flow to the perinal area   Time 12   Period Weeks   Status Achieved     PT LONG TERM GOAL #4   Title Pt will demo proper alignment and propioception with fitness routines in order to minimze relapse of Sx   Time 12   Period Weeks   Status Achieved     PT LONG TERM GOAL #5   Title Pt will demo less antlagic gait, increased stance time on L LE, and  increased gait speed from 1.06 m/s to > 1.2 m/s  in order to show improved knee pain and to perform his ADLSs (3/38: 1.4 m/s)    Time 12   Period Weeks   Status Achieved          Jerl Mina ,PT, DPT, E-RYT  10/25/2016, 4:16 PM  [place], [place], [address] Phone: [phone]   Fax:  [phone]

## 2016-11-01 ENCOUNTER — Ambulatory Visit: Payer: Medicare Other | Attending: Gastroenterology

## 2016-11-01 DIAGNOSIS — R279 Unspecified lack of coordination: Secondary | ICD-10-CM | POA: Diagnosis present

## 2016-11-01 DIAGNOSIS — M25662 Stiffness of left knee, not elsewhere classified: Secondary | ICD-10-CM | POA: Diagnosis present

## 2016-11-01 DIAGNOSIS — M6281 Muscle weakness (generalized): Secondary | ICD-10-CM | POA: Diagnosis present

## 2016-11-01 DIAGNOSIS — M25562 Pain in left knee: Secondary | ICD-10-CM | POA: Insufficient documentation

## 2016-11-01 NOTE — Therapy (Signed)
[place] MAIN [facility] SERVICES [address] [place], [place], [address] Phone: [phone]   Fax:  [phone]  Physical Therapy Treatment  Patient Details  Name: Brad Randall MRN: [id number] Date of Birth: Jan 21, 1946 Referring Provider: Dr. Roland Rack  Encounter Date: 11/01/2016      PT End of Session - 11/01/16 0903    Visit Number 4   Number of Visits 12   Date for PT Re-Evaluation 11/23/16   Authorization Type 4/10 G code   PT Start Time 0845   PT Stop Time 0930   PT Time Calculation (min) 45 min   Activity Tolerance Patient tolerated treatment well;No increased pain   Behavior During Therapy WFL for tasks assessed/performed      Past Medical History:  Diagnosis Date  . Anxiety and depression   . Cancer ([place])    Skin Cancer;      Squamous cell carcinoma of rectum  . Depression   . GERD (gastroesophageal reflux disease)   . Headache(784.0)    Migraines  . High cholesterol   . HTN (hypertension)   . Hyperlipidemia   . Hyperlipidemia   . Memory loss   . Syncopal episodes     Past Surgical History:  Procedure Laterality Date  . COLONOSCOPY WITH PROPOFOL N/A 09/13/2015   Procedure: COLONOSCOPY WITH PROPOFOL;  Surgeon: Hulen Luster, MD;  Location: [facility] ENDOSCOPY;  Service: Gastroenterology;  Laterality: N/A;  . EUS N/A 09/30/2015   Procedure: LOWER ENDOSCOPIC ULTRASOUND (EUS);  Surgeon: Holly Bodily, MD;  Location: [facility] ENDOSCOPY;  Service: Gastroenterology;  Laterality: N/A;  . FLEXIBLE SIGMOIDOSCOPY N/A 10/20/2016   Procedure: FLEXIBLE SIGMOIDOSCOPY;  Surgeon: [name], MD;  Location: [facility] ENDOSCOPY;  Service: Endoscopy;  Laterality: N/A;  . KNEE ARTHROSCOPY WITH MENISCAL REPAIR Left 10/02/2016   Procedure: KNEE ARTHROSCOPY WITH Partial lateral , & medial menisectomy;  Surgeon: Corky Mull, MD;  Location: [facility] ORS;  Service: Orthopedics;  Laterality: Left;  . MOHS SURGERY      There were no vitals filed for this  visit.      Subjective Assessment - 11/01/16 0858    Subjective Patient reports no main complaints currently states he had increased pain after helping a group of people build a ramp over the weekend.    Pertinent History Hx of CA   Limitations Lifting;Standing;Walking   Patient Stated Goals To return back to full functioning   Currently in Pain? No/denies      TREATMENT: Bridges in [place] - with focus on performing glute activation 2 x 20 Sidelying hip abduction - 2 x 20  Clamshells in sidelying - 2 x 20  SLR in supine with focus on activation quadriceps control - 2 x 20 Single leg stance on B side - 90x 2  sec with finger-tip support TKE against black tubing - 2 x 20  Retrostep B with one foot in front with ant/post weight shift - x 20    Manual therapy: Patellar mobilizations in long sitting, lateral/medial, ant/post - 3 x 30sec each to improve patellar motion with functional activity with exercise.      PT Education - 11/01/16 0900    Education provided Yes   Education Details form/technique with exercise   Person(s) Educated Patient   Methods Explanation;Demonstration   Comprehension Verbalized understanding;Returned demonstration             PT Long Term Goals - 10/25/16 1615      PT LONG TERM GOAL #  1   Title Pt will decrease his COREFO score from  % to   < %  in order to show improved bowel function   Time 12   Period Weeks   Status Deferred     PT LONG TERM GOAL #2   Title Pt will demo proper pelvic floor coordination with diaphragm and TrA mm in order to progress towards strengthening exercises   Time 12   Period Weeks   Status Achieved     PT LONG TERM GOAL #3   Title Pt will demo proper contraction of pelvic floor mm in order to decrease leakage and promote blood flow to the perinal area   Time 12   Period Weeks   Status Achieved     PT LONG TERM GOAL #4   Title Pt will demo proper alignment and propioception with fitness routines in order  to minimze relapse of Sx   Time 12   Period Weeks   Status Achieved     PT LONG TERM GOAL #5   Title Pt will demo less antlagic gait, increased stance time on L LE, and  increased gait speed from 1.06 m/s to > 1.2 m/s in order to show improved knee pain and to perform his ADLS   Time 12   Period Weeks   Status Achieved               Plan - 11/01/16 0911    Clinical Impression Statement Continued to focus on improving quadriceps control, coordination and basic strengthening to improve LE function. Patient demonstrates improved patellar movement today indicating improved motor control. Patient demonstrates increased redness around the lateral incision and educated patient to watch incision for signs of infection and contract the MD if redness gets worse. Patient will benefit from further skilled therapy to return to prior level of function.    Rehab Potential Good   Clinical Impairments Affecting Rehab Potential (+) highly motivated, family support (-) history of CA   PT Frequency 2x / week   PT Duration 8 weeks   PT Treatment/Interventions Aquatic Therapy;ADLs/Self Care Home Management;Moist Heat;Iontophoresis 4mg /ml Dexamethasone;Ultrasound;Therapeutic exercise;Balance training;Therapeutic activities;Gait training;Neuromuscular re-education;Patient/family education;Passive range of motion;Manual techniques;Dry needling   PT Next Visit Plan Isometric quad/hamstring; hip strengthening   PT Home Exercise Plan quad/hamstring iso's, ball roll outs under foot, hip ext/abd in standing   Consulted and Agree with Plan of Care Patient      Patient will benefit from skilled therapeutic intervention in order to improve the following deficits and impairments:  Decreased balance, Decreased endurance, Decreased range of motion, Decreased strength, Abnormal gait, Decreased coordination, Increased muscle spasms, Difficulty walking, Increased fascial restricitons, Decreased mobility  Visit  Diagnosis: Unspecified lack of coordination  Muscle weakness (generalized)  Stiffness of left knee, not elsewhere classified  Left knee pain, unspecified chronicity     Problem List Patient Active Problem List   Diagnosis Date Noted  . Anal cancer ([place]) 10/06/2015  . Mood disorder ([place]) 06/14/2015  . Memory loss 12/03/2012  . 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 Unspecified essential hypertension 12/03/2012  . Other and unspecified hyperlipidemia 12/03/2012  . HA (headache) 02/19/2012    Blythe Stanford, PT DPT 11/01/2016, 9:30 AM  [place] MAIN [facility] SERVICES [address] [place], [place], [address] Phone: [phone]   Fax:  [phone]  Name: Brad Randall MRN: [id number] Date of Birth: [birth year]

## 2016-11-06 ENCOUNTER — Ambulatory Visit: Payer: Medicare Other

## 2016-11-06 DIAGNOSIS — R279 Unspecified lack of coordination: Secondary | ICD-10-CM | POA: Diagnosis not present

## 2016-11-06 DIAGNOSIS — M6281 Muscle weakness (generalized): Secondary | ICD-10-CM

## 2016-11-06 DIAGNOSIS — M25662 Stiffness of left knee, not elsewhere classified: Secondary | ICD-10-CM

## 2016-11-06 NOTE — Therapy (Signed)
[place] MAIN [facility] SERVICES [address] [place], [place], [address] Phone: [phone]   Fax:  [phone]  Physical Therapy Treatment  Patient Details  Name: Brad Randall MRN: [id number] Date of Birth: March 06, 1946 Referring Provider: Dr. Roland Rack  Encounter Date: 11/06/2016      PT End of Session - 11/06/16 0916    Visit Number 5   Number of Visits 12   Date for PT Re-Evaluation 11/23/16   Authorization Type 5/10 G code   PT Start Time 0848   PT Stop Time 0930   PT Time Calculation (min) 42 min   Activity Tolerance Patient tolerated treatment well;No increased pain   Behavior During Therapy WFL for tasks assessed/performed      Past Medical History:  Diagnosis Date  . Anxiety and depression   . Cancer ([place])    Skin Cancer;      Squamous cell carcinoma of rectum  . Depression   . GERD (gastroesophageal reflux disease)   . Headache(784.0)    Migraines  . High cholesterol   . HTN (hypertension)   . Hyperlipidemia   . Hyperlipidemia   . Memory loss   . Syncopal episodes     Past Surgical History:  Procedure Laterality Date  . COLONOSCOPY WITH PROPOFOL N/A 09/13/2015   Procedure: COLONOSCOPY WITH PROPOFOL;  Surgeon: Hulen Luster, MD;  Location: [facility] ENDOSCOPY;  Service: Gastroenterology;  Laterality: N/A;  . EUS N/A 09/30/2015   Procedure: LOWER ENDOSCOPIC ULTRASOUND (EUS);  Surgeon: Holly Bodily, MD;  Location: [facility] ENDOSCOPY;  Service: Gastroenterology;  Laterality: N/A;  . FLEXIBLE SIGMOIDOSCOPY N/A 10/20/2016   Procedure: FLEXIBLE SIGMOIDOSCOPY;  Surgeon: [name], MD;  Location: [facility] ENDOSCOPY;  Service: Endoscopy;  Laterality: N/A;  . KNEE ARTHROSCOPY WITH MENISCAL REPAIR Left 10/02/2016   Procedure: KNEE ARTHROSCOPY WITH Partial lateral , & medial menisectomy;  Surgeon: Corky Mull, MD;  Location: [facility] ORS;  Service: Orthopedics;  Laterality: Left;  . MOHS SURGERY      There were no vitals filed for this  visit.      Subjective Assessment - 11/06/16 0912    Subjective Patient reports no major changes since the prvious visit. 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    Pertinent History Hx of CA   Limitations Lifting;Standing;Walking   Patient Stated Goals To return back to full functioning   Currently in Pain? No/denies      TREATMENT: SLR in supine with focus on activation quadriceps control - 2 x 20 Mini Squats with UE support down to 70 deg knee flexion - 2 x 20  Step taps from airex pad onto Bosu ball - 2 x 30 Single leg stance on B side on airex pad- 90sec x 2 with finger-tip support Weight taps laterally on Bosu - x30 each side Feet together on Bosu balance - 1 min Mini Lunges with UE support - x15 B   Manual therapy: Patellar mobilizations in long sitting, lateral/medial, ant/post - 3 x 30sec each to improve patellar motion with functional activity with exercise.       PT Education - 11/06/16 0915    Education provided Yes   Education Details HEP: SLS stance   Person(s) Educated Patient   Methods Explanation;Demonstration   Comprehension Verbalized understanding;Returned demonstration             PT Long Term Goals - 10/25/16 1615      PT  LONG TERM GOAL #1   Title Pt will decrease his COREFO score from  % to   < %  in order to show improved bowel function   Time 12   Period Weeks   Status Deferred     PT LONG TERM GOAL #2   Title Pt will demo proper pelvic floor coordination with diaphragm and TrA mm in order to progress towards strengthening exercises   Time 12   Period Weeks   Status Achieved     PT LONG TERM GOAL #3   Title Pt will demo proper contraction of pelvic floor mm in order to decrease leakage and promote blood flow to the perinal area   Time 12   Period Weeks   Status Achieved     PT LONG TERM GOAL #4   Title Pt will demo proper alignment and propioception with fitness routines in order  to minimze relapse of Sx   Time 12   Period Weeks   Status Achieved     PT LONG TERM GOAL #5   Title Pt will demo less antlagic gait, increased stance time on L LE, and  increased gait speed from 1.06 m/s to > 1.2 m/s in order to show improved knee pain and to perform his ADLS   Time 12   Period Weeks   Status Achieved               Plan - 11/06/16 0917    Clinical Impression Statement Patient demonstrates improved quadriceps control and introduced mini squats/lunges to improve further strengthening. Although patient is improving, he continues to demonstrate increased levels of the knee feeling uncomfortable at end range indicating decreased motor control and patient will benefit from further skilled therapy to return to prior level of function.    Rehab Potential Good   Clinical Impairments Affecting Rehab Potential (+) highly motivated, family support (-) history of CA   PT Frequency 2x / week   PT Duration 8 weeks   PT Treatment/Interventions Aquatic Therapy;ADLs/Self Care Home Management;Moist Heat;Iontophoresis 4mg /ml Dexamethasone;Ultrasound;Therapeutic exercise;Balance training;Therapeutic activities;Gait training;Neuromuscular re-education;Patient/family education;Passive range of motion;Manual techniques;Dry needling   PT Next Visit Plan Isometric quad/hamstring; hip strengthening   PT Home Exercise Plan quad/hamstring iso's, ball roll outs under foot, hip ext/abd in standing   Consulted and Agree with Plan of Care Patient      Patient will benefit from skilled therapeutic intervention in order to improve the following deficits and impairments:  Decreased balance, Decreased endurance, Decreased range of motion, Decreased strength, Abnormal gait, Decreased coordination, Increased muscle spasms, Difficulty walking, Increased fascial restricitons, Decreased mobility  Visit Diagnosis: Unspecified lack of coordination  Muscle weakness (generalized)  Stiffness of left knee,  not elsewhere classified     Problem List Patient Active Problem List   Diagnosis Date Noted  . 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 Mood disorder ([place]) 06/14/2015  . Memory loss 12/03/2012  . 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 Unspecified essential hypertension 12/03/2012  . Other and unspecified hyperlipidemia 12/03/2012  . HA (headache) 02/19/2012    Blythe Stanford, PT DPT 11/06/2016, 9:32 AM  [place] MAIN [facility] SERVICES [address] [place], [place], [address] Phone: [phone]   Fax:  [phone]  Name: DEMETRIUS MAHLER MRN: [id number] Date of Birth: 16-Feb-1946

## 2016-11-13 ENCOUNTER — Ambulatory Visit: Payer: Medicare Other

## 2016-11-13 DIAGNOSIS — M25662 Stiffness of left knee, not elsewhere classified: Secondary | ICD-10-CM

## 2016-11-13 DIAGNOSIS — R279 Unspecified lack of coordination: Secondary | ICD-10-CM | POA: Diagnosis not present

## 2016-11-13 DIAGNOSIS — M6281 Muscle weakness (generalized): Secondary | ICD-10-CM

## 2016-11-13 NOTE — Therapy (Signed)
[place] MAIN [facility] SERVICES [address] [place], [place], [address] Phone: [phone]   Fax:  [phone]  Physical Therapy Treatment  Patient Details  Name: Brad Randall MRN: [id number] Date of Birth: 1945/11/27 Referring Provider: Dr. Roland Rack  Encounter Date: 11/13/2016      PT End of Session - 11/13/16 1317    Visit Number 6   Number of Visits 12   Date for PT Re-Evaluation 11/23/16   Authorization Type 6/10 G code   PT Start Time 1300   PT Stop Time 1345   PT Time Calculation (min) 45 min   Activity Tolerance Patient tolerated treatment well;No increased pain   Behavior During Therapy WFL for tasks assessed/performed      Past Medical History:  Diagnosis Date  . Anxiety and depression   . Cancer ([place])    Skin Cancer;      Squamous cell carcinoma of rectum  . Depression   . GERD (gastroesophageal reflux disease)   . Headache(784.0)    Migraines  . High cholesterol   . HTN (hypertension)   . Hyperlipidemia   . Hyperlipidemia   . Memory loss   . Syncopal episodes     Past Surgical History:  Procedure Laterality Date  . COLONOSCOPY WITH PROPOFOL N/A 09/13/2015   Procedure: COLONOSCOPY WITH PROPOFOL;  Surgeon: Hulen Luster, MD;  Location: [facility] ENDOSCOPY;  Service: Gastroenterology;  Laterality: N/A;  . EUS N/A 09/30/2015   Procedure: LOWER ENDOSCOPIC ULTRASOUND (EUS);  Surgeon: Holly Bodily, MD;  Location: [facility] ENDOSCOPY;  Service: Gastroenterology;  Laterality: N/A;  . FLEXIBLE SIGMOIDOSCOPY N/A 10/20/2016   Procedure: FLEXIBLE SIGMOIDOSCOPY;  Surgeon: [name], MD;  Location: [facility] ENDOSCOPY;  Service: Endoscopy;  Laterality: N/A;  . KNEE ARTHROSCOPY WITH MENISCAL REPAIR Left 10/02/2016   Procedure: KNEE ARTHROSCOPY WITH Partial lateral , & medial menisectomy;  Surgeon: Corky Mull, MD;  Location: [facility] ORS;  Service: Orthopedics;  Laterality: Left;  . MOHS SURGERY      There were no vitals filed for this  visit.      Subjective Assessment - 11/13/16 1308    Subjective Patients no major changes since the previous visit and states he having difficulty with descending down the stairs.    Pertinent History Hx of CA   Limitations Lifting;Standing;Walking   Patient Stated Goals To return back to full functioning   Currently in Pain? No/denies      TREATMENT: SLR in supine with focus on activation quadriceps control - 2 x 25 Single leg heel taps from 8" step (performed throughout shortened range to not aggravate pain) Plyometric forward and backwards; Laterally - 2 x 20sec each direction Mini Lunges with UE support - x20 B  Manual therapy: Patellar mobilizations in long sitting, lateral/medial, ant/post - 3 x 30sec each to improve patellar motion with functional activity with exercise.       PT Education - 11/13/16 1314    Education provided Yes   Education Details Education on technique with descending the stairs    Person(s) Educated Patient   Methods Explanation;Demonstration   Comprehension Verbalized understanding;Returned demonstration             PT Long Term Goals - 10/25/16 1615      PT LONG TERM GOAL #1   Title Pt will decrease his COREFO score from  % to   < %  in order to show improved bowel function   Time 12   Period  Weeks   Status Deferred     PT LONG TERM GOAL #2   Title Pt will demo proper pelvic floor coordination with diaphragm and TrA mm in order to progress towards strengthening exercises   Time 12   Period Weeks   Status Achieved     PT LONG TERM GOAL #3   Title Pt will demo proper contraction of pelvic floor mm in order to decrease leakage and promote blood flow to the perinal area   Time 12   Period Weeks   Status Achieved     PT LONG TERM GOAL #4   Title Pt will demo proper alignment and propioception with fitness routines in order to minimze relapse of Sx   Time 12   Period Weeks   Status Achieved     PT LONG TERM GOAL #5   Title Pt  will demo less antlagic gait, increased stance time on L LE, and  increased gait speed from 1.06 m/s to > 1.2 m/s in order to show improved knee pain and to perform his ADLS   Time 12   Period Weeks   Status Achieved               Plan - 11/13/16 1345    Clinical Impression Statement Patient demonstrates further improvement in knee function demonstrating ability to perform plyometric exercise without increase knee pain. FOcused on performing controlled eccentric CKC loading of the knee to decrease pain with descending the stairs. Patient will benefit from further skilled therapy focused on improving LE function to return to prior level of function.    Rehab Potential Good   Clinical Impairments Affecting Rehab Potential (+) highly motivated, family support (-) history of CA   PT Frequency 2x / week   PT Duration 8 weeks   PT Treatment/Interventions Aquatic Therapy;ADLs/Self Care Home Management;Moist Heat;Iontophoresis 4mg /ml Dexamethasone;Ultrasound;Therapeutic exercise;Balance training;Therapeutic activities;Gait training;Neuromuscular re-education;Patient/family education;Passive range of motion;Manual techniques;Dry needling   PT Next Visit Plan Isometric quad/hamstring; hip strengthening   PT Home Exercise Plan quad/hamstring iso's, ball roll outs under foot, hip ext/abd in standing   Consulted and Agree with Plan of Care Patient      Patient will benefit from skilled therapeutic intervention in order to improve the following deficits and impairments:  Decreased balance, Decreased endurance, Decreased range of motion, Decreased strength, Abnormal gait, Decreased coordination, Increased muscle spasms, Difficulty walking, Increased fascial restricitons, Decreased mobility  Visit Diagnosis: Unspecified lack of coordination  Muscle weakness (generalized)  Stiffness of left knee, not elsewhere classified     Problem List Patient Active Problem List   Diagnosis Date Noted  .  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 Mood disorder ([place]) 06/14/2015  . Memory loss 12/03/2012  . 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 Unspecified essential hypertension 12/03/2012  . Other and unspecified hyperlipidemia 12/03/2012  . HA (headache) 02/19/2012    Blythe Stanford, PT DPT 11/13/2016, 4:07 PM  [place] MAIN [facility] SERVICES [address] [place], [place], [address] Phone: [phone]   Fax:  [phone]  Name: Brad Randall MRN: [id number] Date of Birth: [birth year]

## 2016-11-14 ENCOUNTER — Ambulatory Visit: Payer: Medicare Other | Admitting: Physical Therapy

## 2016-11-22 ENCOUNTER — Ambulatory Visit: Payer: Medicare Other

## 2016-11-22 VITALS — BP 154/93 | HR 55

## 2016-11-22 DIAGNOSIS — M25662 Stiffness of left knee, not elsewhere classified: Secondary | ICD-10-CM

## 2016-11-22 DIAGNOSIS — R279 Unspecified lack of coordination: Secondary | ICD-10-CM | POA: Diagnosis not present

## 2016-11-22 DIAGNOSIS — M6281 Muscle weakness (generalized): Secondary | ICD-10-CM

## 2016-11-22 NOTE — Therapy (Signed)
[place] MAIN [facility] SERVICES [address] [place], [place], [address] Phone: [phone]   Fax:  [phone]  Physical Therapy Treatment/Discharge  Patient Details  Name: Brad Randall MRN: [id number] Date of Birth: 07-Sep-1945 Referring Provider: Dr. Roland Rack  Encounter Date: 11/22/2016      PT End of Session - 11/22/16 1526    Visit Number 7   Number of Visits 12   Date for PT Re-Evaluation 11/23/16   Authorization Type 7/10 G code   PT Start Time 1520   PT Stop Time 1602   PT Time Calculation (min) 42 min   Activity Tolerance Patient tolerated treatment well;No increased pain   Behavior During Therapy WFL for tasks assessed/performed      Past Medical History:  Diagnosis Date  . Anxiety and depression   . Cancer ([place])    Skin Cancer;      Squamous cell carcinoma of rectum  . Depression   . GERD (gastroesophageal reflux disease)   . Headache(784.0)    Migraines  . High cholesterol   . HTN (hypertension)   . Hyperlipidemia   . Hyperlipidemia   . Memory loss   . Syncopal episodes     Past Surgical History:  Procedure Laterality Date  . COLONOSCOPY WITH PROPOFOL N/A 09/13/2015   Procedure: COLONOSCOPY WITH PROPOFOL;  Surgeon: Hulen Luster, MD;  Location: [facility] ENDOSCOPY;  Service: Gastroenterology;  Laterality: N/A;  . EUS N/A 09/30/2015   Procedure: LOWER ENDOSCOPIC ULTRASOUND (EUS);  Surgeon: Holly Bodily, MD;  Location: [facility] ENDOSCOPY;  Service: Gastroenterology;  Laterality: N/A;  . FLEXIBLE SIGMOIDOSCOPY N/A 10/20/2016   Procedure: FLEXIBLE SIGMOIDOSCOPY;  Surgeon: [name], MD;  Location: [facility] ENDOSCOPY;  Service: Endoscopy;  Laterality: N/A;  . KNEE ARTHROSCOPY WITH MENISCAL REPAIR Left 10/02/2016   Procedure: KNEE ARTHROSCOPY WITH Partial lateral , & medial menisectomy;  Surgeon: Corky Mull, MD;  Location: [facility] ORS;  Service: Orthopedics;  Laterality: Left;  . MOHS SURGERY      Vitals:   11/22/16 1527  BP:  (!) 154/93  Pulse: (!) 55  SpO2: 99%        Subjective Assessment - 11/22/16 1523    Subjective Pt reports he is doing well at this time. He has been using his LLE more due to packing and preparing for an upcoming move without any issues. He has continued to ice his knee with increased activity to avoid any problems. He has not been having any further pain. No specific questions or concerns at this time.    Pertinent History Hx of CA   Limitations Lifting;Standing;Walking   Patient Stated Goals To return back to full functioning   Currently in Pain? No/denies            [facility] PT Assessment - 11/22/16 1539      Observation/Other Assessments   Other Surveys  Other Surveys   Lower Extremity Functional Scale  72/80     ROM / Strength   AROM / PROM / Strength Strength;AROM     AROM   AROM Assessment Site Knee   Right/Left Knee Left   Left Knee Extension 0   Left Knee Flexion 132     Strength   Strength Assessment Site Hip;Knee   Right/Left Hip Right;Left   Right Hip Flexion 4/5   Right Hip Extension 4-/5   Right Hip External Rotation  4+/5   Right Hip Internal Rotation 4+/5   Right Hip ABduction 4-/5  Right Hip ADduction 4-/5   Left Hip Flexion 4/5   Left Hip Extension 4-/5   Left Hip External Rotation 4+/5   Left Hip Internal Rotation 4+/5   Left Hip ABduction 3+/5   Left Hip ADduction 4-/5   Right/Left Knee Right;Left   Right Knee Flexion 5/5   Right Knee Extension 5/5   Left Knee Flexion 5/5   Left Knee Extension 5/5      TREATMENT:  Ther-ex Pt completed LEFS (unbilled); Updated goals and discussed discharge with patient; MMT and ROM assessment of bilateral LE (see flowsheet), mild decrease in L quad length noted in [name] test position; Single leg heel raises x 20 bilateral; SLR in supine 2 x 15, no quad lag noted; Quantum single leg press 105# x 10 bilateral, 130# x 19 RLE, 130# x 20 LLE; Lateral lunges in // bars x 15 Plyometric forward and backwards;  Laterally - 2 x 20sec each direction; Lateral step-ups to 6" step x 10 Single leg heel taps from 6" step x 10; Written HEP progression provided to patient                        PT Education - 11/22/16 1525    Education provided Yes   Education Details HEP, goals, discharge   Person(s) Educated Patient   Methods Explanation   Comprehension Verbalized understanding             PT Long Term Goals - 11/22/16 1526      PT LONG TERM GOAL #1   Title Patient will improve LEFS score to 64/80 to demonstrate improvement in knee function and improved ability to ambulate.   Baseline LEFS: 43/80, 11/22/16: 72/80   Time 8   Period Weeks   Status Achieved     PT LONG TERM GOAL #2   Title Patient will be independent with HEP to continue benefits of therapy after discharge.    Baseline dependent with exercise performance    Time 8   Period Weeks   Status Achieved     PT LONG TERM GOAL #3   Title Pt with demonstrate full knee AROM to allow for greater ease with walking and functional bending/squating activities   Baseline Pt knee AROM:  0- 90deg; 11/22/16: 0-132 degrees   Time 12   Period Weeks   Status Achieved     PT LONG TERM GOAL #4   Title Pt will be able to balance in SLS B for >10 sec to demonstrate improvement in balance and propioception leading to decreased fall risk   Baseline Not assessed on initial visit, 11/22/16: no benchmark found so deferred testing. Pt can perform lateral bounding without issue.    Time 12   Period Weeks   Status Deferred               Plan - 11/22/16 1526    Clinical Impression Statement Pt reports no further L knee pain at this time. He reports he is not limited at this time due to his knee. His L knee AROM is 0-132 degrees. His LEFS today is 72/80 which is improved from 43/80 at his initial evaluation. He is independent with his home exercise program. Pt demonstrates some mild L quad tightness and some mild L hip  abduction weakness when compared to RLE. Provided additional HEP to correct these issues. Otherwise no deficits are identified at this time. Pt has met his goals and will be discharged on this date.  He was encouraged to obtain a new order from his MD if he does not continue to make the progress he expects.    Rehab Potential Good   Clinical Impairments Affecting Rehab Potential (+) highly motivated, family support (-) history of CA   PT Frequency 2x / week   PT Duration 8 weeks   PT Treatment/Interventions Aquatic Therapy;ADLs/Self Care Home Management;Moist Heat;Iontophoresis 23m/ml Dexamethasone;Ultrasound;Therapeutic exercise;Balance training;Therapeutic activities;Gait training;Neuromuscular re-education;Patient/family education;Passive range of motion;Manual techniques;Dry needling   PT Next Visit Plan Discharge   PT Home Exercise Plan quad/hamstring iso's, ball roll outs under foot, hip ext/abd in standing, lateral lunges, lateral bounding, lateral step-ups, L single leg step downs, prone quad stretch with towel/belt   Consulted and Agree with Plan of Care Patient      Patient will benefit from skilled therapeutic intervention in order to improve the following deficits and impairments:  Decreased balance, Decreased endurance, Decreased range of motion, Decreased strength, Abnormal gait, Decreased coordination, Increased muscle spasms, Difficulty walking, Increased fascial restricitons, Decreased mobility  Visit Diagnosis: Muscle weakness (generalized)  Stiffness of left knee, not elsewhere classified       G-Codes - [id number]    Functional Assessment Tool Used (Outpatient Only) clinical judgement, LEFS   Functional Limitation Mobility: Walking and moving around   Mobility: Walking and Moving Around Goal Status [phone] At least 1 percent but less than 20 percent impaired, limited or restricted   Mobility: Walking and Moving Around Discharge Status [phone] At least 1 percent but  less than 20 percent impaired, limited or restricted      Problem List Patient Active Problem List   Diagnosis Date Noted  . Anal cancer ([place] 10/06/2015  . Mood disorder ([place] 06/14/2015  . Memory loss 12/03/2012  . 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 Unspecified essential hypertension 12/03/2012  . Other and unspecified hyperlipidemia 12/03/2012  . HA (headache) 02/19/2012   JPhillips GroutPT, DPT   Jerel Sardina 4May 11, 2018 5:07 PM  [place] [address] [place] [address]Phone: [phone]  Fax:  [phone] Name: STILDEN BROZMRN: [id number] of Birth: 1Apr 27, 1947

## 2016-12-07 ENCOUNTER — Encounter: Payer: Self-pay | Admitting: Radiation Oncology

## 2016-12-07 ENCOUNTER — Ambulatory Visit
Admission: RE | Admit: 2016-12-07 | Discharge: 2016-12-07 | Disposition: A | Discharge status: Home / Self Care | Payer: Medicare Other | Source: Ambulatory Visit | Attending: Radiation Oncology | Admitting: Radiation Oncology

## 2016-12-07 VITALS — BP 158/81 | HR 56 | Temp 97.2°F | Wt 163.8 lb

## 2016-12-07 DIAGNOSIS — Z923 Personal history of irradiation: Secondary | ICD-10-CM | POA: Insufficient documentation

## 2016-12-07 DIAGNOSIS — C210 Malignant neoplasm of anus, unspecified: Secondary | ICD-10-CM

## 2016-12-07 DIAGNOSIS — Z85048 Personal history of other malignant neoplasm of rectum, rectosigmoid junction, and anus: Secondary | ICD-10-CM | POA: Insufficient documentation

## 2016-12-07 NOTE — Progress Notes (Signed)
Radiation Oncology Follow up Note  Name: Brad Randall   Date:   12/07/2016 MRN:  [id number] DOB: 15-May-1946    This 71 y.o. male presents to the clinic today for 1 year follow-up for anal cancer status post chemoradiation.  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 He is seen today in routine follow-up and is doing well. He specifically denies diarrhea dysuria or any other GI/GU complaints.. He had a PET scan back in June 2017 showing no evidence of hypermetabolic activity. He is scheduled for repeat CT scan in June 2018.  COMPLICATIONS OF TREATMENT: none  FOLLOW UP COMPLIANCE: keeps appointments   PHYSICAL EXAM:  BP (!) 158/81   Pulse (!) 56   Temp 97.2 F (36.2 C)   Wt 163 lb 12.8 oz (74.3 kg)   BMI 24.91 kg/m  On rectal exam rectal sphincter tone is good no evidence of mass or nodularity in the anal region is noted. Prostate is smooth contracted without evidence of nodularity or mass no other rectal abnormality is identified. Well-developed well-nourished patient in NAD. HEENT reveals PERLA, EOMI, discs not visualized.  Oral cavity is clear. No oral mucosal lesions are identified. Neck is clear without evidence of cervical or supraclavicular adenopathy. Lungs are clear to A&P. Cardiac examination is essentially unremarkable with regular rate and rhythm without murmur rub or thrill. Abdomen is benign with no organomegaly or masses noted. Motor sensory and DTR levels are equal and symmetric in the upper and lower extremities. Cranial nerves II through XII are grossly intact. Proprioception is intact. No peripheral adenopathy or edema is identified. No motor or sensory levels are noted. Crude visual fields are within normal range.  RADIOLOGY RESULTS: Prior PET/CT scan is reviewed and compatible above-stated findings I will review his CT scan scheduled  for June.  PLAN: Present time 1 year out he continues to do well with no evidence of disease. I'm please were overall progress. I've asked to see him back in 1 year for follow-up. Patient may be moving to [place] we will forward his medical records if that is an option. Patient knows to call at anytime with any concerns.  I would like to take this opportunity to thank you for allowing me to participate in the care of your patient.Armstead Peaks., MD

## 2017-01-02 ENCOUNTER — Ambulatory Visit: Payer: Medicare Other

## 2017-01-09 ENCOUNTER — Other Ambulatory Visit: Payer: Self-pay

## 2017-01-09 ENCOUNTER — Ambulatory Visit: Payer: Self-pay | Admitting: Oncology

## 2017-01-18 ENCOUNTER — Ambulatory Visit
Admission: RE | Admit: 2017-01-18 | Discharge: 2017-01-18 | Disposition: A | Discharge status: Home / Self Care | Payer: Medicare Other | Source: Ambulatory Visit | Attending: Oncology | Admitting: Oncology

## 2017-01-18 DIAGNOSIS — C210 Malignant neoplasm of anus, unspecified: Secondary | ICD-10-CM | POA: Insufficient documentation

## 2017-01-18 DIAGNOSIS — K7689 Other specified diseases of liver: Secondary | ICD-10-CM | POA: Diagnosis not present

## 2017-01-18 LAB — POCT I-STAT CREATININE: CREATININE: 1.4 mg/dL — AB (ref 0.61–1.24)

## 2017-01-18 MED ORDER — IOPAMIDOL (ISOVUE-300) INJECTION 61%
100.0000 mL | Freq: Once | INTRAVENOUS | Status: AC | PRN
  Administered 2017-01-18: 100 mL via INTRAVENOUS

## 2017-01-21 NOTE — Progress Notes (Signed)
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 He currently feels well and is asymptomatic. He has noted no changes in his bowel movements. He reports no nausea, vomiting, diarrhea, or constipation.  He has no melena or hematochezia. He denies any neurologic complaints. He has no chest pain or shortness of breath. He has a good appetite and denies weight loss. has no urinary complaints. He offers no further specific complaints today.  REVIEW OF  SYSTEMS:   Review of Systems  Constitutional: Negative for fever and weight loss.  Respiratory: Negative.  Negative for cough and shortness of breath.   Cardiovascular: Negative.  Negative for chest pain and leg swelling.  Gastrointestinal: Negative.  Negative for abdominal pain, blood in stool, constipation, diarrhea and melena.  Genitourinary: Negative.   Musculoskeletal: Negative for back pain, myalgias and neck pain.  Skin: Negative for rash.  Neurological: Negative.  Negative for tingling, weakness and headaches.  Psychiatric/Behavioral: Negative.  The patient is not nervous/anxious and does not have insomnia.     As per HPI. Otherwise, a complete review of systems is negative.  PAST MEDICAL HISTORY: Past Medical History:  Diagnosis Date  . Anxiety and depression   . Cancer ([place])    Skin Cancer;      Squamous cell carcinoma of rectum  . Depression   . GERD (gastroesophageal reflux disease)   . Headache(784.0)    Migraines  . High cholesterol   . HTN (hypertension)   . Hyperlipidemia   . Hyperlipidemia   . Memory loss   . Syncopal episodes     PAST SURGICAL HISTORY: Past Surgical  History:  Procedure Laterality Date  . COLONOSCOPY WITH PROPOFOL N/A 09/13/2015   Procedure: COLONOSCOPY WITH PROPOFOL;  Surgeon: Hulen Luster, MD;  Location: [facility] ENDOSCOPY;  Service: Gastroenterology;  Laterality: N/A;  . EUS N/A 09/30/2015   Procedure: LOWER ENDOSCOPIC ULTRASOUND (EUS);  Surgeon: Holly Bodily, MD;  Location: [facility] ENDOSCOPY;  Service: Gastroenterology;  Laterality: N/A;  . FLEXIBLE SIGMOIDOSCOPY N/A 10/20/2016   Procedure: FLEXIBLE SIGMOIDOSCOPY;  Surgeon: [name], MD;  Location: [facility] ENDOSCOPY;  Service: Endoscopy;  Laterality: N/A;  . KNEE ARTHROSCOPY WITH MENISCAL REPAIR Left 10/02/2016   Procedure: KNEE ARTHROSCOPY WITH Partial lateral , & medial menisectomy;  Surgeon: Corky Mull, MD;  Location: [facility] ORS;  Service: Orthopedics;  Laterality: Left;  . MOHS SURGERY      FAMILY HISTORY Family History  Problem Relation Age of Onset  . Osteoporosis Mother   . Stroke Mother   . Lymphoma Mother   . Squamous cell carcinoma Father   . Lung cancer Father   . Esophageal cancer Brother   . Cancer Unknown   . Cancer Other        ADVANCED DIRECTIVES:    HEALTH MAINTENANCE: Social History  Substance Use Topics  . Smoking status: Never Smoker  . Smokeless tobacco: Never Used  . Alcohol use No    Allergies  Allergen Reactions  . Sertraline Other (See Comments)    Makes skin crawl  . Topiramate Other (See Comments)    Mania    Current Outpatient Prescriptions  Medication Sig Dispense Refill  . bisoprolol-hydrochlorothiazide (  ZIAC) 2.5-6.25 MG per tablet Take 1 tablet by mouth 2 (two) times daily.     Marland Kitchen escitalopram (LEXAPRO) 10 MG tablet Take 15 mg by mouth daily.   1  . fluticasone (FLONASE) 50 MCG/ACT nasal spray Place 2 sprays into both nostrils daily as needed for rhinitis.     Marland Kitchen HYDROcodone-acetaminophen (NORCO) 5-325 MG tablet Take 1-2 tablets by mouth every 6 (six) hours as needed for moderate pain. MAXIMUM TOTAL ACETAMINOPHEN DOSE IS 4000 MG  PER DAY 30 tablet 0  . KLOR-CON 10 10 MEQ tablet Take 10 mEq by mouth daily.  5  . Multiple Vitamins-Minerals (MULTIVITAMIN WITH MINERALS) tablet Take 1 tablet by mouth daily.    Marland Kitchen omeprazole (PRILOSEC) 40 MG capsule Take 40 mg by mouth every evening.     . sildenafil (REVATIO) 20 MG tablet Take 1-5 prn    . sodium bicarbonate 650 MG tablet Take 1,300 mg by mouth every evening. Reported on 01/31/2016    . zonisamide (ZONEGRAN) 25 MG capsule Take 75 mg by mouth at bedtime.      No current facility-administered medications for this visit.     OBJECTIVE: Vitals:   01/22/17 1455  BP: (!) 158/85  Pulse: 65  Resp: 20  Temp: 97.1 F (36.2 C)     Body mass index is 24.63 kg/m.    ECOG FS:0 - Asymptomatic  General: Well-developed, well-nourished, no acute distress. Eyes: Pink conjunctiva, anicteric sclera. Lungs: Clear to auscultation bilaterally. Heart: Regular rate and rhythm. No rubs, murmurs, or gallops. Abdomen: Soft, nontender, nondistended. No organomegaly noted, normoactive bowel sounds. Musculoskeletal: No edema, cyanosis, or clubbing. Neuro: Alert, answering all questions appropriately. Cranial nerves grossly intact. Skin: No rashes or petechiae noted. Psych: Anxious.   LAB RESULTS:  Lab Results  Component Value Date   NA 138 01/22/2017   K 3.5 01/22/2017   CL 106 01/22/2017   CO2 24 01/22/2017   GLUCOSE 137 (H) 01/22/2017   BUN 18 01/22/2017   CREATININE 1.44 (H) 01/22/2017   CALCIUM 9.1 01/22/2017   PROT 6.9 01/22/2017   ALBUMIN 4.2 01/22/2017   AST 25 01/22/2017   ALT 16 (L) 01/22/2017   ALKPHOS 61 01/22/2017   BILITOT 0.9 01/22/2017   GFRNONAA 48 (L) 01/22/2017   GFRAA 56 (L) 01/22/2017    Lab Results  Component Value Date   WBC 3.4 (L) 01/22/2017   NEUTROABS 2.2 01/22/2017   HGB 15.6 01/22/2017   HCT 43.9 01/22/2017   MCV 90.3 01/22/2017   PLT 142 (L) 01/22/2017     STUDIES: Ct Abdomen Pelvis W Contrast  Result Date: 01/18/2017 CLINICAL DATA:   Restaging anal cancer. EXAM: CT ABDOMEN AND PELVIS WITH CONTRAST TECHNIQUE: Multidetector CT imaging of the abdomen and pelvis was performed using the standard protocol following bolus administration of intravenous contrast. CONTRAST:  185mL ISOVUE-300 IOPAMIDOL (ISOVUE-300) INJECTION 61% COMPARISON:  PET-CT 01/27/2016 and prior PET from 10/08/2015 FINDINGS: Lower chest: The lung bases are clear of acute process. No pleural effusion or pulmonary lesions. The heart is normal in size. No pericardial effusion. The distal esophagus and aorta are unremarkable. Hepatobiliary: No focal hepatic lesions or intrahepatic biliary dilatation. Small scattered hepatic cysts are stable. The portal and hepatic veins are patent. Gallbladder is normal. No common bile duct dilatation. Pancreas: No mass, inflammation or ductal dilatation. Spleen: Normal size.  No focal lesions. Adrenals/Urinary Tract: The adrenal glands and kidneys are unremarkable and stable. Stomach/Bowel: The stomach, duodenum, small bowel and colon are unremarkable. No  inflammatory changes, mass lesions or obstructive findings. The terminal ileum is normal. The appendix is not identified. Mild uniform thickening of the anal region appears stable and is most likely due to radiation. No discrete mass and no perianal or perirectal mass or adenopathy. Vascular/Lymphatic: The aorta and branch vessels are patent. Scattered atherosclerotic calcifications are stable. The branch vessels are normal. No mesenteric or retroperitoneal mass or lymphadenopathy. No pelvic or inguinal adenopathy. Reproductive: The prostate gland and seminal vesicles appear normal. Other: No pelvic mass or adenopathy. No free pelvic fluid collections. No inguinal mass or adenopathy. No abdominal wall hernia or subcutaneous lesions. Musculoskeletal: No significant bony findings. No evidence of metastatic bone disease. IMPRESSION: 1. Mild uniform thickening in the anal region is likely radiation change  and appears stable. No CT findings to suggest recurrent tumor, regional lymphadenopathy or metastatic disease. 2. No acute abdominal/pelvic findings 3. Stable simple appearing hepatic cysts. Electronically Signed   By: Marijo Sanes M.D.   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 Patient has an evaluation at [facility] in the near future for direct visualization as well as manometry. Per NCCN guidelines, patient will require an anoscope every 6-12 months for 2 years after completing treatment which was on November 25, 2015. Will also do abdominal and pelvic CT with contrast annually through 2020. Patient reports he is moving to [place] and will continue follow-up there. An appointment was made for 6 months but this likely will be canceled.    2. Thrombocytopenia: Improved. Mild, continue to monitor. 3. Renal insufficiency: Creatinine mildly elevated today, monitor.    4. 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 Monitor.  Patient expressed understanding and was in agreement with this plan. He also understands that He can call clinic at any time with any questions, concerns, or complaints.    Lloyd Huger, MD 01/26/17 1:35 PM

## 2017-01-22 ENCOUNTER — Inpatient Hospital Stay (HOSPITAL_BASED_OUTPATIENT_CLINIC_OR_DEPARTMENT_OTHER): Payer: Medicare Other | Admitting: Oncology

## 2017-01-22 ENCOUNTER — Inpatient Hospital Stay: Payer: Medicare Other | Attending: Oncology | Admitting: *Deleted

## 2017-01-22 VITALS — BP 158/85 | HR 65 | Temp 97.1°F | Resp 20 | Wt 162.0 lb

## 2017-01-22 DIAGNOSIS — N289 Disorder of kidney and ureter, unspecified: Secondary | ICD-10-CM

## 2017-01-22 DIAGNOSIS — K219 Gastro-esophageal reflux disease without esophagitis: Secondary | ICD-10-CM | POA: Diagnosis not present

## 2017-01-22 DIAGNOSIS — K7689 Other specified diseases of liver: Secondary | ICD-10-CM | POA: Diagnosis not present

## 2017-01-22 DIAGNOSIS — Z85048 Personal history of other malignant neoplasm of rectum, rectosigmoid junction, and anus: Secondary | ICD-10-CM

## 2017-01-22 DIAGNOSIS — D72819 Decreased white blood cell count, unspecified: Secondary | ICD-10-CM | POA: Diagnosis not present

## 2017-01-22 DIAGNOSIS — C210 Malignant neoplasm of anus, unspecified: Secondary | ICD-10-CM

## 2017-01-22 DIAGNOSIS — F419 Anxiety disorder, unspecified: Secondary | ICD-10-CM | POA: Diagnosis not present

## 2017-01-22 DIAGNOSIS — E7800 Pure hypercholesterolemia, unspecified: Secondary | ICD-10-CM | POA: Diagnosis not present

## 2017-01-22 DIAGNOSIS — Z85828 Personal history of other malignant neoplasm of skin: Secondary | ICD-10-CM | POA: Diagnosis not present

## 2017-01-22 DIAGNOSIS — Z79899 Other long term (current) drug therapy: Secondary | ICD-10-CM | POA: Insufficient documentation

## 2017-01-22 DIAGNOSIS — I10 Essential (primary) hypertension: Secondary | ICD-10-CM | POA: Diagnosis not present

## 2017-01-22 DIAGNOSIS — F329 Major depressive disorder, single episode, unspecified: Secondary | ICD-10-CM | POA: Insufficient documentation

## 2017-01-22 DIAGNOSIS — D696 Thrombocytopenia, unspecified: Secondary | ICD-10-CM | POA: Diagnosis not present

## 2017-01-22 LAB — CBC WITH DIFFERENTIAL/PLATELET
BASOS PCT: 1 %
Basophils Absolute: 0 10*3/uL (ref 0–0.1)
EOS ABS: 0.1 10*3/uL (ref 0–0.7)
Eosinophils Relative: 3 %
HCT: 43.9 % (ref 40.0–52.0)
HEMOGLOBIN: 15.6 g/dL (ref 13.0–18.0)
Lymphocytes Relative: 20 %
Lymphs Abs: 0.7 10*3/uL — ABNORMAL LOW (ref 1.0–3.6)
MCH: 32.1 pg (ref 26.0–34.0)
MCHC: 35.5 g/dL (ref 32.0–36.0)
MCV: 90.3 fL (ref 80.0–100.0)
Monocytes Absolute: 0.3 10*3/uL (ref 0.2–1.0)
Monocytes Relative: 10 %
NEUTROS PCT: 66 %
Neutro Abs: 2.2 10*3/uL (ref 1.4–6.5)
Platelets: 142 10*3/uL — ABNORMAL LOW (ref 150–440)
RBC: 4.86 MIL/uL (ref 4.40–5.90)
RDW: 12.8 % (ref 11.5–14.5)
WBC: 3.4 10*3/uL — AB (ref 3.8–10.6)

## 2017-01-22 LAB — COMPREHENSIVE METABOLIC PANEL
ALBUMIN: 4.2 g/dL (ref 3.5–5.0)
ALK PHOS: 61 U/L (ref 38–126)
ALT: 16 U/L — AB (ref 17–63)
ANION GAP: 8 (ref 5–15)
AST: 25 U/L (ref 15–41)
BUN: 18 mg/dL (ref 6–20)
CALCIUM: 9.1 mg/dL (ref 8.9–10.3)
CHLORIDE: 106 mmol/L (ref 101–111)
CO2: 24 mmol/L (ref 22–32)
Creatinine, Ser: 1.44 mg/dL — ABNORMAL HIGH (ref 0.61–1.24)
GFR calc Af Amer: 56 mL/min — ABNORMAL LOW (ref >60)
GFR calc non Af Amer: 48 mL/min — ABNORMAL LOW (ref >60)
GLUCOSE: 137 mg/dL — AB (ref 65–99)
Potassium: 3.5 mmol/L (ref 3.5–5.1)
SODIUM: 138 mmol/L (ref 135–145)
Total Bilirubin: 0.9 mg/dL (ref 0.3–1.2)
Total Protein: 6.9 g/dL (ref 6.5–8.1)

## 2017-01-22 NOTE — Progress Notes (Signed)
Patient denies any concerns today.  

## 2017-04-27 ENCOUNTER — Ambulatory Visit: Admission: RE | Admit: 2017-04-27 | Payer: Medicare Other | Source: Ambulatory Visit | Admitting: Gastroenterology

## 2017-04-27 ENCOUNTER — Encounter: Admission: RE | Payer: Self-pay | Source: Ambulatory Visit

## 2017-04-27 SURGERY — SIGMOIDOSCOPY, FLEXIBLE
Anesthesia: General

## 2017-08-05 NOTE — Progress Notes (Deleted)
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 He currently feels well and is asymptomatic. He has noted no changes in his bowel movements. He reports no nausea, vomiting, diarrhea, or constipation.  He has no melena or hematochezia. He denies any neurologic complaints. He has no chest pain or shortness of breath. He has a good appetite and denies weight loss. has no urinary complaints. He offers no further specific complaints today.  REVIEW OF  SYSTEMS:   Review of Systems  Constitutional: Negative for fever and weight loss.  Respiratory: Negative.  Negative for cough and shortness of breath.   Cardiovascular: Negative.  Negative for chest pain and leg swelling.  Gastrointestinal: Negative.  Negative for abdominal pain, blood in stool, constipation, diarrhea and melena.  Genitourinary: Negative.   Musculoskeletal: Negative for back pain, myalgias and neck pain.  Skin: Negative for rash.  Neurological: Negative.  Negative for tingling, weakness and headaches.  Psychiatric/Behavioral: Negative.  The patient is not nervous/anxious and does not have insomnia.     As per HPI. Otherwise, a complete review of systems is negative.  PAST MEDICAL HISTORY: Past Medical History:  Diagnosis Date  . Anxiety and depression   . Cancer ([place])    Skin Cancer;      Squamous cell carcinoma of rectum  . Depression   . GERD (gastroesophageal reflux disease)   . Headache(784.0)    Migraines  . High cholesterol   . HTN (hypertension)   . Hyperlipidemia   . Hyperlipidemia   . Memory loss   . Syncopal episodes     PAST SURGICAL HISTORY: Past Surgical  History:  Procedure Laterality Date  . COLONOSCOPY WITH PROPOFOL N/A 09/13/2015   Procedure: COLONOSCOPY WITH PROPOFOL;  Surgeon: Hulen Luster, MD;  Location: [facility] ENDOSCOPY;  Service: Gastroenterology;  Laterality: N/A;  . EUS N/A 09/30/2015   Procedure: LOWER ENDOSCOPIC ULTRASOUND (EUS);  Surgeon: Holly Bodily, MD;  Location: [facility] ENDOSCOPY;  Service: Gastroenterology;  Laterality: N/A;  . FLEXIBLE SIGMOIDOSCOPY N/A 10/20/2016   Procedure: FLEXIBLE SIGMOIDOSCOPY;  Surgeon: [name], MD;  Location: [facility] ENDOSCOPY;  Service: Endoscopy;  Laterality: N/A;  . KNEE ARTHROSCOPY WITH MENISCAL REPAIR Left 10/02/2016   Procedure: KNEE ARTHROSCOPY WITH Partial lateral , & medial menisectomy;  Surgeon: Corky Mull, MD;  Location: [facility] ORS;  Service: Orthopedics;  Laterality: Left;  . MOHS SURGERY      FAMILY HISTORY Family History  Problem Relation Age of Onset  . Osteoporosis Mother   . Stroke Mother   . Lymphoma Mother   . Squamous cell carcinoma Father   . Lung cancer Father   . Esophageal cancer Brother   . Cancer Unknown   . Cancer Other        ADVANCED DIRECTIVES:    HEALTH MAINTENANCE: Social History   Tobacco Use  . Smoking status: Never Smoker  . Smokeless tobacco: Never Used  Substance Use Topics  . Alcohol use: No  . Drug use: No    Allergies  Allergen Reactions  . Sertraline Other (See Comments)    Makes skin crawl  . Topiramate Other (See Comments)    Mania    Current Outpatient  Medications  Medication Sig Dispense Refill  . bisoprolol-hydrochlorothiazide (ZIAC) 2.5-6.25 MG per tablet Take 1 tablet by mouth 2 (two) times daily.     Marland Kitchen escitalopram (LEXAPRO) 10 MG tablet Take 15 mg by mouth daily.   1  . fluticasone (FLONASE) 50 MCG/ACT nasal spray Place 2 sprays into both nostrils daily as needed for rhinitis.     Marland Kitchen HYDROcodone-acetaminophen (NORCO) 5-325 MG tablet Take 1-2 tablets by mouth every 6 (six) hours as needed for moderate pain. MAXIMUM TOTAL  ACETAMINOPHEN DOSE IS 4000 MG PER DAY 30 tablet 0  . KLOR-CON 10 10 MEQ tablet Take 10 mEq by mouth daily.  5  . Multiple Vitamins-Minerals (MULTIVITAMIN WITH MINERALS) tablet Take 1 tablet by mouth daily.    Marland Kitchen omeprazole (PRILOSEC) 40 MG capsule Take 40 mg by mouth every evening.     . sildenafil (REVATIO) 20 MG tablet Take 1-5 prn    . sodium bicarbonate 650 MG tablet Take 1,300 mg by mouth every evening. Reported on 01/31/2016    . zonisamide (ZONEGRAN) 25 MG capsule Take 75 mg by mouth at bedtime.      No current facility-administered medications for this visit.     OBJECTIVE: There were no vitals filed for this visit.   There is no height or weight on file to calculate BMI.    ECOG FS:0 - Asymptomatic  General: Well-developed, well-nourished, no acute distress. Eyes: Pink conjunctiva, anicteric sclera. Lungs: Clear to auscultation bilaterally. Heart: Regular rate and rhythm. No rubs, murmurs, or gallops. Abdomen: Soft, nontender, nondistended. No organomegaly noted, normoactive bowel sounds. Musculoskeletal: No edema, cyanosis, or clubbing. Neuro: Alert, answering all questions appropriately. Cranial nerves grossly intact. Skin: No rashes or petechiae noted. Psych: Anxious.   LAB RESULTS:  Lab Results  Component Value Date   NA 138 01/22/2017   K 3.5 01/22/2017   CL 106 01/22/2017   CO2 24 01/22/2017   GLUCOSE 137 (H) 01/22/2017   BUN 18 01/22/2017   CREATININE 1.44 (H) 01/22/2017   CALCIUM 9.1 01/22/2017   PROT 6.9 01/22/2017   ALBUMIN 4.2 01/22/2017   AST 25 01/22/2017   ALT 16 (L) 01/22/2017   ALKPHOS 61 01/22/2017   BILITOT 0.9 01/22/2017   GFRNONAA 48 (L) 01/22/2017   GFRAA 56 (L) 01/22/2017    Lab Results  Component Value Date   WBC 3.4 (L) 01/22/2017   NEUTROABS 2.2 01/22/2017   HGB 15.6 01/22/2017   HCT 43.9 01/22/2017   MCV 90.3 01/22/2017   PLT 142 (L) 01/22/2017     STUDIES: No results found.  ASSESSMENT: Stage II squamous cell cancer of the  anus   PLAN:    1. 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 Patient has an evaluation at [facility] in the near future for direct visualization as well as manometry. Per NCCN guidelines, patient will require an anoscope every 6-12 months for 2 years after completing treatment which was on November 25, 2015. Will also do abdominal and pelvic CT with contrast annually through 2020. Patient reports he is moving to [place] and will continue follow-up there. An appointment was made for 6 months but this likely will be canceled.    2. Thrombocytopenia: Improved. Mild, continue to monitor. 3. Renal insufficiency: Creatinine mildly elevated today, monitor.    4. 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 Monitor.  Patient expressed understanding and was in agreement with this plan. He also understands that He can call clinic at any time with any questions, concerns, or complaints.    Lloyd Huger, MD 08/05/17 9:40 PM

## 2017-08-08 ENCOUNTER — Inpatient Hospital Stay: Payer: Medicare Other | Admitting: Oncology

## 2017-08-08 ENCOUNTER — Inpatient Hospital Stay: Payer: Medicare Other

## 2017-10-09 ENCOUNTER — Ambulatory Visit: Payer: Medicare Other | Admitting: Oncology

## 2017-10-09 ENCOUNTER — Other Ambulatory Visit: Payer: Medicare Other

## 2017-12-20 ENCOUNTER — Ambulatory Visit: Payer: Self-pay | Admitting: Radiation Oncology

## 2018-05-14 IMAGING — MR MR KNEE*L* W/O CM
5 series · 33 of 40 positions shown · non-contrast
Comparison: Report from 08/29/2016

CLINICAL DATA: A chronic left knee pain and swelling, with acute
injury 1 week ago and current sensation of instability.

EXAM:
MRI OF THE LEFT KNEE WITHOUT CONTRAST
TECHNIQUE: Multiplanar, multisequence MR imaging of the knee was performed. No
intravenous contrast was administered.

[Series 3: PD fat-sat · axial · 3.0mm · 0.50mm/px · z∈[-54,+68]mm · 8 of 38 slices shown (1 of 3)]
[im 1/38]
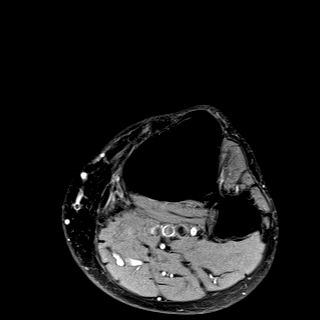
[im 6/38]
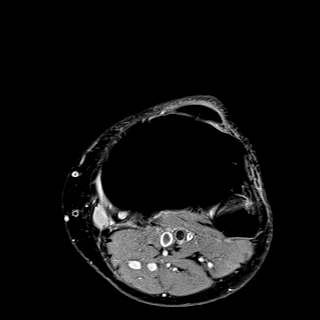
[im 11/38]
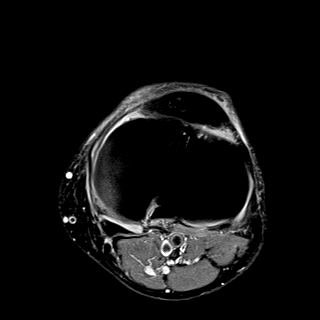
[im 16/38]
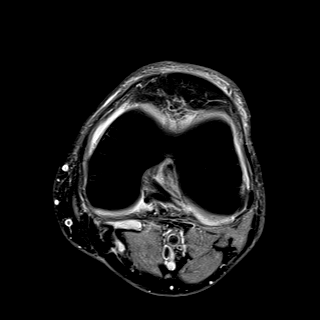
[im 22/38]
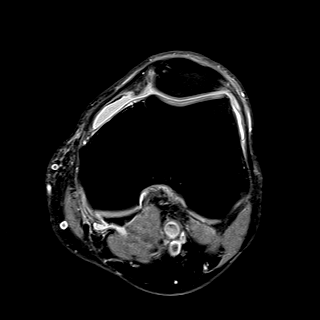
[im 27/38]
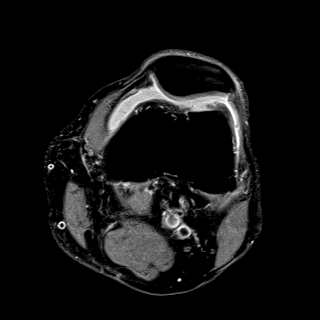
[im 32/38]
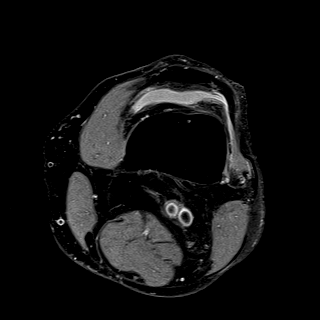
[im 38/38]
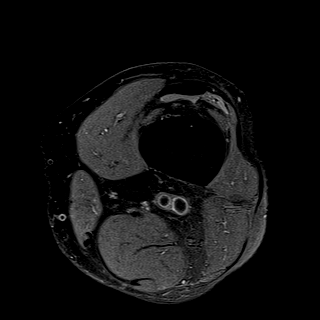

[Series 6: PD fat-sat · sagittal · 3.0mm · 0.50mm/px · 8 of 32 slices shown (2 of 3)]
[im 1/32]
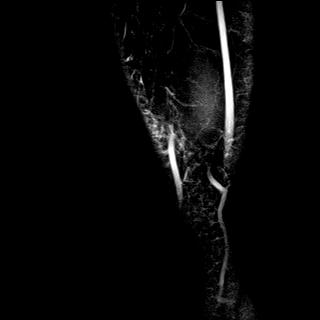
[im 5/32]
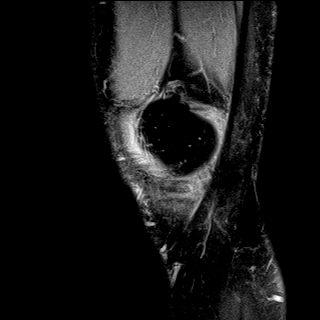
[im 9/32]
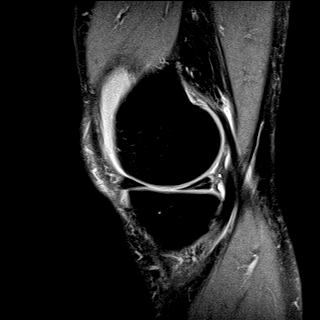
[im 14/32]
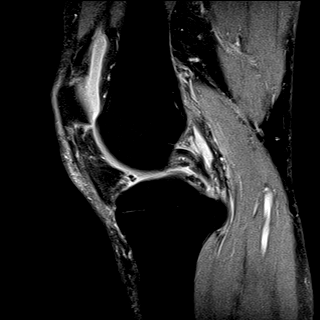
[im 18/32]
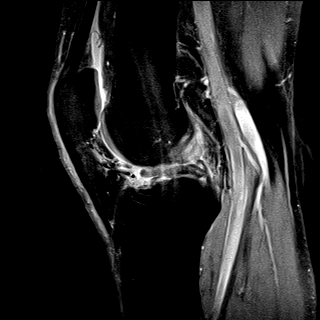
[im 23/32]
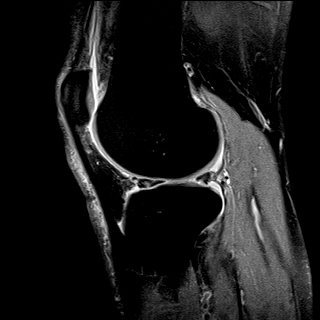
[im 27/32]
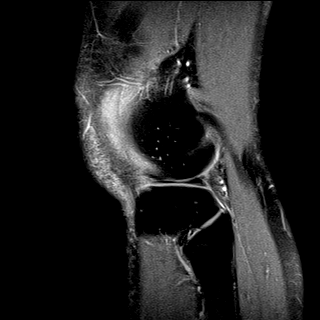
[im 32/32]
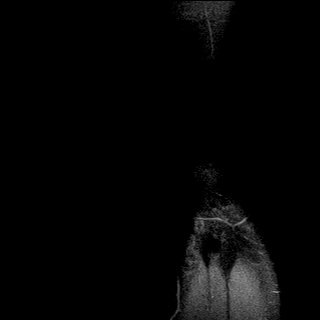

[Series 7: PD fat-sat · coronal · 3.0mm · 0.50mm/px · 8 of 32 slices shown (3 of 3)]
[im 1/32]
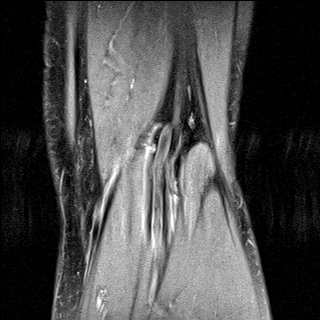
[im 5/32]
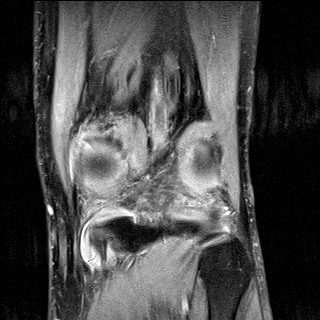
[im 9/32]
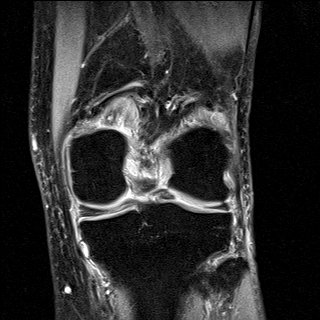
[im 14/32]
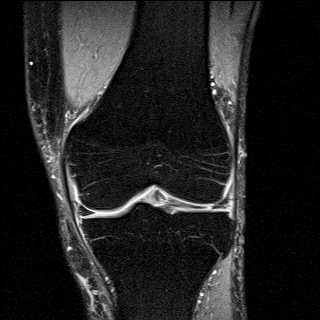
[im 18/32]
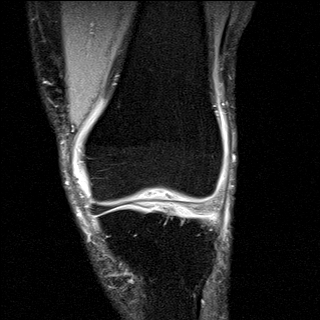
[im 23/32]
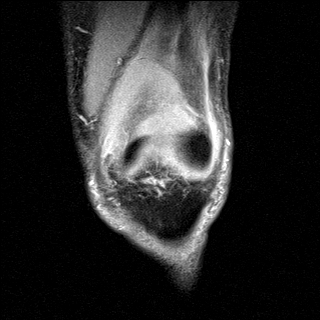
[im 27/32]
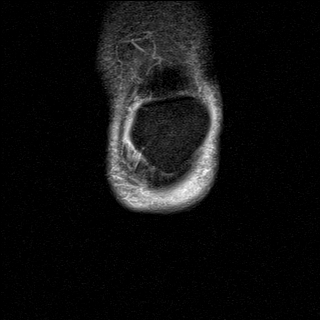
[im 32/32]
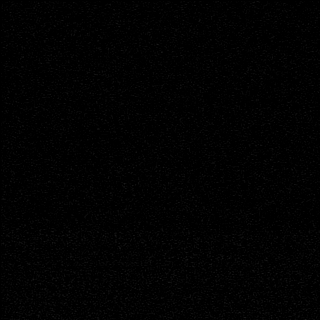

[Series 8: T2 fat-sat · coronal · 3.0mm · 0.31mm/px · 8 of 32 slices shown]
[im 1/32]
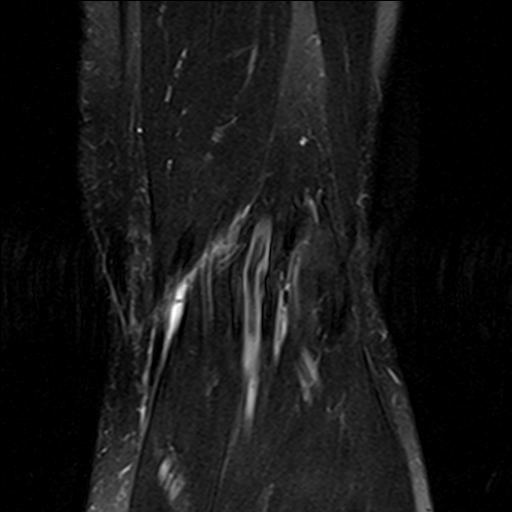
[im 5/32]
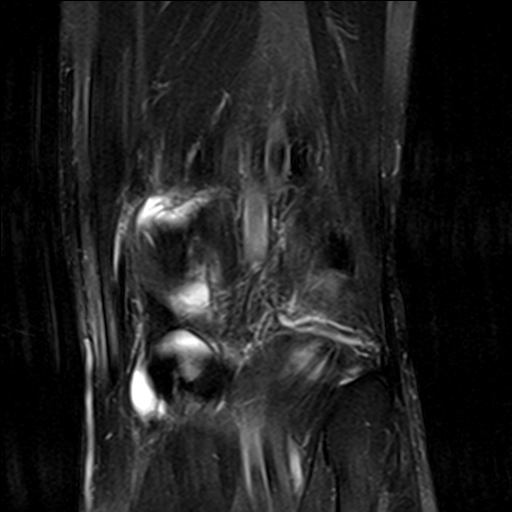
[im 9/32]
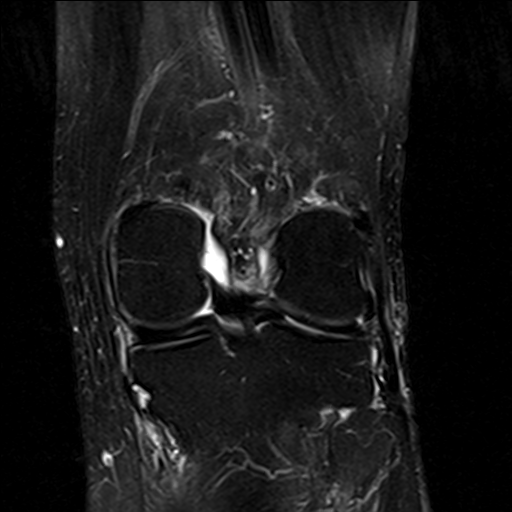
[im 14/32]
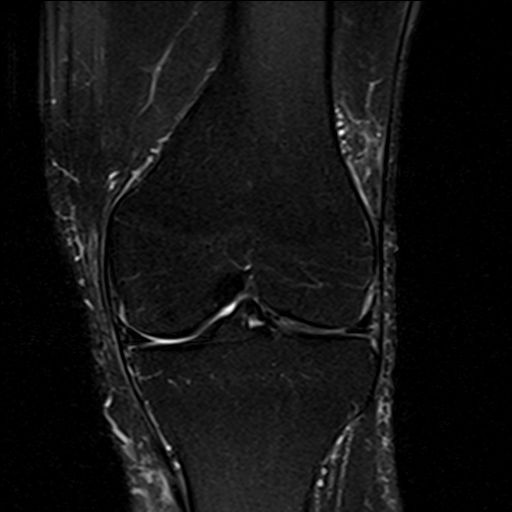
[im 18/32]
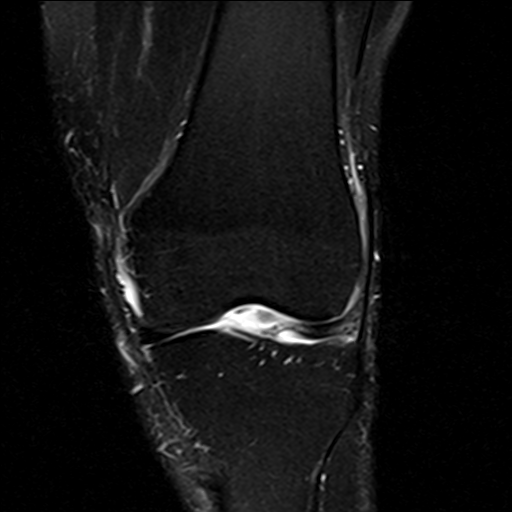
[im 23/32]
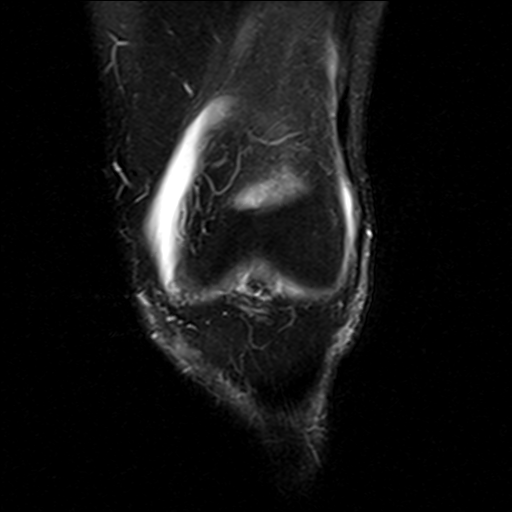
[im 27/32]
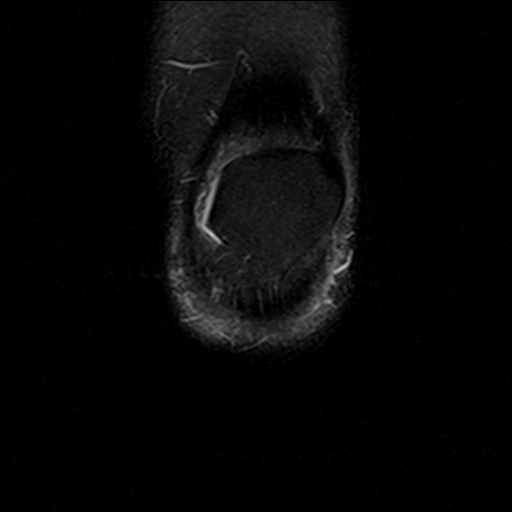
[im 32/32]
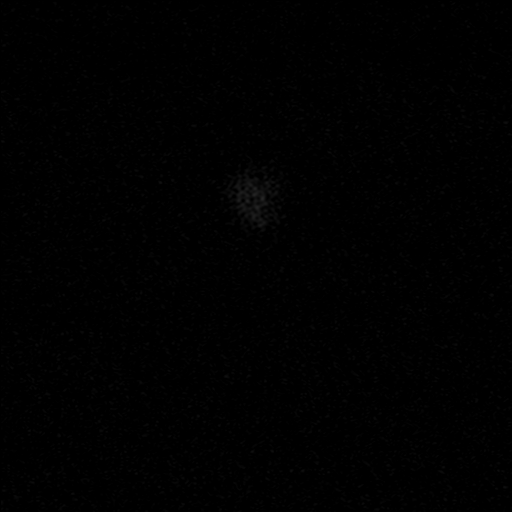

[Series 9: T1 · coronal · 3.0mm · 0.50mm/px · 1 of 32 slices shown]
[im 1/32]
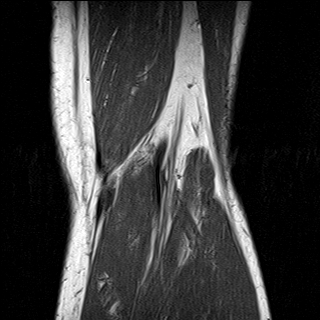

[33 of 40 positions shown; findings below may reference images not displayed]

FINDINGS: MENISCI

Medial meniscus: Oblique grade 3 tear of the posterior horn medial
meniscus extending to the inferior surface on images 9 through 11 of
series 6.

Lateral meniscus: Bucket-handle tear of the lateral meniscus with
the bucket-handle extending over towards the intercondylar notch,
shown on image [DATE] and image [DATE]. Grade 3 vertical signal extends
longitudinally in the orthotopic anterior segment on images 22-23
series 6 and there is some horizontal signal involving the superior
surface of the orthotopic portion of the midbody on image [DATE]. Lax
meniscal flap along the anterior margin of the bucket-handle on
images 15 through 17 of series 8, also well shown on image [DATE].

LIGAMENTS

Cruciates:  Unremarkable

Collaterals:  Unremarkable

CARTILAGE

Patellofemoral: Mild chondral surface irregularity along the lateral
patellar facet. Mild degenerative chondral thinning in the femoral
trochlear groove.

Medial:  Mild degenerative chondral thinning.

Lateral:  Mild degenerative chondral thinning.

Joint:  Small knee effusion.

Popliteal Fossa: Small to moderate size Baker's cyst. Mild
semimembranosus-tibial collateral ligament bursitis. Mild pes
anserine bursitis.

Extensor Mechanism: Tibial tubercle -trochlear groove distance
cm.

Bones:  Unremarkable

Other: No supplemental non-categorized findings.
IMPRESSION: 1. The dominant finding is a large bucket-handle tear of the lateral
meniscus extending towards the intercondylar notch. There is a lax
flap like component of the meniscus along the anterior margin of the
bucket-handle.
2. Oblique grade 3 tear of the posterior horn medial meniscus.
3. Mild chondral surface irregularity along the lateral patellar
facet with mild degenerative chondral thinning in all 3
compartments.
4. Small knee effusion.  Small to moderate size Baker's cyst.
5. Mild pes anserine bursitis and mild adjacent semimembranosus
-tibial collateral ligament bursitis.
